# Patient Record
Sex: Female | Born: 1965 | Race: White | Hispanic: No | Marital: Married | Smoking: Never smoker
Health system: Southern US, Community
[De-identification: ages and names within clinical notes are randomized; demographics above are authoritative.]

## PROBLEM LIST (undated history)

## (undated) DIAGNOSIS — Z9889 Other specified postprocedural states: Secondary | ICD-10-CM

## (undated) DIAGNOSIS — T4145XA Adverse effect of unspecified anesthetic, initial encounter: Secondary | ICD-10-CM

## (undated) DIAGNOSIS — R112 Nausea with vomiting, unspecified: Secondary | ICD-10-CM

## (undated) DIAGNOSIS — T8859XA Other complications of anesthesia, initial encounter: Secondary | ICD-10-CM

## (undated) HISTORY — PX: DILATION AND CURETTAGE OF UTERUS: SHX78

## (undated) HISTORY — PX: LITHOTRIPSY: SUR834

## (undated) HISTORY — PX: OTHER SURGICAL HISTORY: SHX169

---

## 1998-02-03 ENCOUNTER — Ambulatory Visit (HOSPITAL_COMMUNITY): Admission: RE | Admit: 1998-02-03 | Discharge: 1998-02-03 | Payer: Self-pay | Admitting: Obstetrics & Gynecology

## 1998-09-10 ENCOUNTER — Inpatient Hospital Stay (HOSPITAL_COMMUNITY): Admission: AD | Admit: 1998-09-10 | Discharge: 1998-09-10 | Payer: Self-pay | Admitting: Obstetrics and Gynecology

## 1998-09-10 ENCOUNTER — Encounter: Payer: Self-pay | Admitting: Obstetrics and Gynecology

## 1998-09-30 ENCOUNTER — Ambulatory Visit (HOSPITAL_COMMUNITY): Admission: RE | Admit: 1998-09-30 | Discharge: 1998-09-30 | Payer: Self-pay | Admitting: Obstetrics and Gynecology

## 1999-05-30 ENCOUNTER — Other Ambulatory Visit: Admission: RE | Admit: 1999-05-30 | Discharge: 1999-05-30 | Payer: Self-pay | Admitting: Gynecology

## 2000-06-21 ENCOUNTER — Other Ambulatory Visit: Admission: RE | Admit: 2000-06-21 | Discharge: 2000-06-21 | Payer: Self-pay | Admitting: Gynecology

## 2002-11-24 ENCOUNTER — Other Ambulatory Visit: Admission: RE | Admit: 2002-11-24 | Discharge: 2002-11-24 | Payer: Self-pay | Admitting: Gynecology

## 2004-06-15 ENCOUNTER — Other Ambulatory Visit: Admission: RE | Admit: 2004-06-15 | Discharge: 2004-06-15 | Payer: Self-pay | Admitting: Obstetrics and Gynecology

## 2005-03-06 ENCOUNTER — Encounter: Admission: RE | Admit: 2005-03-06 | Discharge: 2005-03-06 | Payer: Self-pay | Admitting: Obstetrics and Gynecology

## 2005-10-15 ENCOUNTER — Other Ambulatory Visit: Admission: RE | Admit: 2005-10-15 | Discharge: 2005-10-15 | Payer: Self-pay | Admitting: Obstetrics and Gynecology

## 2011-02-14 ENCOUNTER — Encounter (HOSPITAL_COMMUNITY): Payer: Self-pay | Admitting: Pharmacist

## 2011-02-21 ENCOUNTER — Encounter (HOSPITAL_COMMUNITY)
Admission: RE | Admit: 2011-02-21 | Discharge: 2011-02-21 | Disposition: A | Discharge status: Home / Self Care | Payer: BC Managed Care – PPO | Source: Ambulatory Visit | Attending: Obstetrics and Gynecology | Admitting: Obstetrics and Gynecology

## 2011-02-21 ENCOUNTER — Encounter (HOSPITAL_COMMUNITY): Payer: Self-pay

## 2011-02-21 HISTORY — DX: Nausea with vomiting, unspecified: Z98.890

## 2011-02-21 HISTORY — DX: Nausea with vomiting, unspecified: R11.2

## 2011-02-21 HISTORY — DX: Other complications of anesthesia, initial encounter: T88.59XA

## 2011-02-21 HISTORY — DX: Adverse effect of unspecified anesthetic, initial encounter: T41.45XA

## 2011-02-21 LAB — SURGICAL PCR SCREEN
MRSA, PCR: NEGATIVE
Staphylococcus aureus: NEGATIVE

## 2011-02-21 LAB — CBC
MCHC: 32.2 g/dL (ref 30.0–36.0)
Platelets: 234 10*3/uL (ref 150–400)
RDW: 12.8 % (ref 11.5–15.5)
WBC: 8.4 10*3/uL (ref 4.0–10.5)

## 2011-02-21 NOTE — Patient Instructions (Addendum)
20 MISCHELLE REEG  02/21/2011   Your procedure is scheduled on:  03/01/11  Enter through the Main Entrance of [facility] at 830 AM.  Pick up the phone at the desk and dial [phone].   Call this number if you have problems the morning of surgery: [phone]   Remember:   Do not eat food:After Midnight.  Do not drink clear liquids: After Midnight.  Take these medicines the morning of surgery with A SIP OF WATER: NA   Do not wear jewelry, make-up or nail polish.  Do not wear lotions, powders, or perfumes. You may wear deodorant.  Do not shave 48 hours prior to surgery.  Do not bring valuables to the hospital.  Contacts, dentures or bridgework may not be worn into surgery.  Leave suitcase in the car. After surgery it may be brought to your room.  For patients admitted to the hospital, checkout time is 11:00 AM the day of discharge.   Patients discharged the day of surgery will not be allowed to drive home.  Name and phone number of your driver: Mother   Nile Dear      Special Instructions: CHG Shower Use Special Wash: 1/2 bottle night before surgery and 1/2 bottle morning of surgery.   Please read over the following fact sheets that you were given: MRSA Information

## 2011-02-28 NOTE — H&P (Signed)
Jessica Ball is an 46 y.o. female, P 1001, with heavy menses who desires permanent sterility . She was previously on OCP, but does not want hormonal contraception anymore.  Off OCP she is having heavy menses with BTB every 3 months or so.  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    Pertinent Gynecological History: Last pap: normal Date: 12-12 OB History: G1, P1001, c-section at term   Menstrual History: No LMP recorded.    Past Medical History  Diagnosis Date  . Complication of anesthesia   . PONV (postoperative nausea and vomiting)   Nephrolithiasis  Past Surgical History  Procedure Date  . Dilation and curettage of uterus   . Lithotripsy   Cesarean section Hysteroscopy with resection of septum Laparoscopy for endometriosis  No family history on file.  Social History:  reports that she has quit smoking. She does not have any smokeless tobacco history on file. She reports that she drinks alcohol. She reports that she does not use illicit drugs.  Allergies: No Known Allergies  No prescriptions prior to admission    Review of Systems  Respiratory: Negative.   Cardiovascular: Negative.   Gastrointestinal: Negative.   Genitourinary: Negative.     There were no vitals taken for this visit. Physical Exam  Constitutional: She appears well-developed and well-nourished.  Neck: Neck supple. No thyromegaly present.  Cardiovascular: Normal rate, regular rhythm and normal heart sounds.   No murmur heard. Respiratory: Effort normal and breath sounds normal. No respiratory distress. She has no wheezes.  GI: Soft. She exhibits no distension and no mass. There is no tenderness.       Transverse scar  Genitourinary: Vagina normal.       Uterus is midplanar, normal size No adnexal mass    No results found for this or any previous visit (from the past 24 hour(s)).  No results  found.  Assessment/Plan: Menorrhagia with some BTB, desires permanent sterility.  All medical and surgical options were discussed.  Laparoscopic BTL procedure, risks, failure rate, alternatives discussed.  Endometrial ablation with HTA discussed as well, would use HTA due to possible arcuate uterus vs. Septum.  Will admit on 2021 for laparoscopic BTL and endometrial ablation with HTA.    Ken Bonn D 02/28/2011, 6:35 PM

## 2011-03-01 ENCOUNTER — Ambulatory Visit (HOSPITAL_COMMUNITY): Payer: BC Managed Care – PPO | Admitting: Anesthesiology

## 2011-03-01 ENCOUNTER — Encounter (HOSPITAL_COMMUNITY): Payer: Self-pay | Admitting: Anesthesiology

## 2011-03-01 ENCOUNTER — Inpatient Hospital Stay (HOSPITAL_COMMUNITY)
Admission: AD | Admit: 2011-03-01 | Discharge: 2011-03-01 | Disposition: A | Discharge status: Home / Self Care | Payer: BC Managed Care – PPO | Source: Ambulatory Visit | Attending: Obstetrics and Gynecology | Admitting: Obstetrics and Gynecology

## 2011-03-01 ENCOUNTER — Encounter (HOSPITAL_COMMUNITY): Payer: Self-pay | Admitting: *Deleted

## 2011-03-01 ENCOUNTER — Ambulatory Visit (HOSPITAL_COMMUNITY)
Admission: RE | Admit: 2011-03-01 | Discharge: 2011-03-01 | Disposition: A | Discharge status: Home Health | Payer: BC Managed Care – PPO | Source: Ambulatory Visit | Attending: Obstetrics and Gynecology | Admitting: Obstetrics and Gynecology

## 2011-03-01 ENCOUNTER — Encounter (HOSPITAL_COMMUNITY)
Admission: RE | Disposition: A | Discharge status: Home Health | Payer: Self-pay | Source: Ambulatory Visit | Attending: Obstetrics and Gynecology

## 2011-03-01 DIAGNOSIS — Z01818 Encounter for other preprocedural examination: Secondary | ICD-10-CM | POA: Insufficient documentation

## 2011-03-01 DIAGNOSIS — R300 Dysuria: Secondary | ICD-10-CM

## 2011-03-01 DIAGNOSIS — IMO0002 Reserved for concepts with insufficient information to code with codable children: Secondary | ICD-10-CM | POA: Insufficient documentation

## 2011-03-01 DIAGNOSIS — N803 Endometriosis of pelvic peritoneum, unspecified: Secondary | ICD-10-CM | POA: Insufficient documentation

## 2011-03-01 DIAGNOSIS — Z302 Encounter for sterilization: Secondary | ICD-10-CM | POA: Insufficient documentation

## 2011-03-01 DIAGNOSIS — N920 Excessive and frequent menstruation with regular cycle: Secondary | ICD-10-CM | POA: Diagnosis present

## 2011-03-01 DIAGNOSIS — Z01812 Encounter for preprocedural laboratory examination: Secondary | ICD-10-CM | POA: Insufficient documentation

## 2011-03-01 HISTORY — PX: LAPAROSCOPIC TUBAL LIGATION: SHX1937

## 2011-03-01 LAB — URINALYSIS, ROUTINE W REFLEX MICROSCOPIC
Bilirubin Urine: NEGATIVE
Leukocytes, UA: NEGATIVE
Nitrite: NEGATIVE
Specific Gravity, Urine: >1.03 — ABNORMAL HIGH (ref 1.005–1.030)
pH: 6.5 (ref 5.0–8.0)

## 2011-03-01 SURGERY — LIGATION, FALLOPIAN TUBE, LAPAROSCOPIC
Anesthesia: General | Site: Vagina | Wound class: Clean Contaminated

## 2011-03-01 MED ORDER — METOCLOPRAMIDE HCL 5 MG/ML IJ SOLN: 10.0000 mg | Freq: Once | INTRAMUSCULAR | Status: DC | PRN

## 2011-03-01 MED ORDER — NEOSTIGMINE METHYLSULFATE 1 MG/ML IJ SOLN
INTRAMUSCULAR | Status: AC
  Filled 2011-03-01: qty 10

## 2011-03-01 MED ORDER — MEPERIDINE HCL 25 MG/ML IJ SOLN
INTRAMUSCULAR | Status: AC
  Filled 2011-03-01: qty 1

## 2011-03-01 MED ORDER — ONDANSETRON HCL 4 MG/2ML IJ SOLN
INTRAMUSCULAR | Status: AC
  Filled 2011-03-01: qty 2

## 2011-03-01 MED ORDER — BUPIVACAINE HCL (PF) 0.25 % IJ SOLN
INTRAMUSCULAR | Status: AC
  Filled 2011-03-01: qty 30

## 2011-03-01 MED ORDER — FENTANYL CITRATE 0.05 MG/ML IJ SOLN
25.0000 ug | INTRAMUSCULAR | Status: DC | PRN
  Administered 2011-03-01 (×2): 50 ug via INTRAVENOUS

## 2011-03-01 MED ORDER — DEXAMETHASONE SODIUM PHOSPHATE 10 MG/ML IJ SOLN
INTRAMUSCULAR | Status: AC
  Filled 2011-03-01: qty 1

## 2011-03-01 MED ORDER — LIDOCAINE HCL (CARDIAC) 20 MG/ML IV SOLN
INTRAVENOUS | Status: AC
  Filled 2011-03-01: qty 5

## 2011-03-01 MED ORDER — DEXAMETHASONE SODIUM PHOSPHATE 10 MG/ML IJ SOLN
INTRAMUSCULAR | Status: DC | PRN
  Administered 2011-03-01: 10 mg via INTRAVENOUS

## 2011-03-01 MED ORDER — FENTANYL CITRATE 0.05 MG/ML IJ SOLN
INTRAMUSCULAR | Status: DC | PRN
  Administered 2011-03-01: 50 ug via INTRAVENOUS
  Administered 2011-03-01 (×2): 100 ug via INTRAVENOUS

## 2011-03-01 MED ORDER — SODIUM CHLORIDE 0.9 % IJ SOLN
INTRAMUSCULAR | Status: DC | PRN
  Administered 2011-03-01: 10 mL via INTRAVENOUS

## 2011-03-01 MED ORDER — HYDROMORPHONE HCL PF 1 MG/ML IJ SOLN
INTRAMUSCULAR | Status: AC
  Administered 2011-03-01: 1 mg
  Filled 2011-03-01: qty 1

## 2011-03-01 MED ORDER — MEPERIDINE HCL 25 MG/ML IJ SOLN
6.2500 mg | INTRAMUSCULAR | Status: DC | PRN
  Administered 2011-03-01: 12.5 mg via INTRAVENOUS

## 2011-03-01 MED ORDER — OXYCODONE-ACETAMINOPHEN 5-325 MG PO TABS: 1.0000 | ORAL_TABLET | ORAL | Status: AC | PRN

## 2011-03-01 MED ORDER — SCOPOLAMINE 1 MG/3DAYS TD PT72
MEDICATED_PATCH | TRANSDERMAL | Status: AC
  Filled 2011-03-01: qty 1

## 2011-03-01 MED ORDER — KETOROLAC TROMETHAMINE 30 MG/ML IJ SOLN
INTRAMUSCULAR | Status: DC | PRN
  Administered 2011-03-01: 30 mg via INTRAVENOUS

## 2011-03-01 MED ORDER — ROCURONIUM BROMIDE 50 MG/5ML IV SOLN
INTRAVENOUS | Status: AC
  Filled 2011-03-01: qty 1

## 2011-03-01 MED ORDER — BUPIVACAINE HCL (PF) 0.25 % IJ SOLN
INTRAMUSCULAR | Status: DC | PRN
  Administered 2011-03-01: 10 mL

## 2011-03-01 MED ORDER — MIDAZOLAM HCL 2 MG/2ML IJ SOLN
INTRAMUSCULAR | Status: AC
  Filled 2011-03-01: qty 2

## 2011-03-01 MED ORDER — FENTANYL CITRATE 0.05 MG/ML IJ SOLN
INTRAMUSCULAR | Status: AC
  Filled 2011-03-01: qty 5

## 2011-03-01 MED ORDER — LACTATED RINGERS IV SOLN
INTRAVENOUS | Status: DC
  Administered 2011-03-01 (×2): via INTRAVENOUS

## 2011-03-01 MED ORDER — ROCURONIUM BROMIDE 100 MG/10ML IV SOLN
INTRAVENOUS | Status: DC | PRN
  Administered 2011-03-01: 30 mg via INTRAVENOUS

## 2011-03-01 MED ORDER — SODIUM CHLORIDE 0.9 % IR SOLN
Status: DC | PRN
  Administered 2011-03-01: 3000 mL

## 2011-03-01 MED ORDER — FENTANYL CITRATE 0.05 MG/ML IJ SOLN
INTRAMUSCULAR | Status: AC
  Administered 2011-03-01: 50 ug via INTRAVENOUS
  Filled 2011-03-01: qty 2

## 2011-03-01 MED ORDER — SCOPOLAMINE 1 MG/3DAYS TD PT72
1.0000 | MEDICATED_PATCH | Freq: Once | TRANSDERMAL | Status: DC
  Administered 2011-03-01: 1.5 mg via TRANSDERMAL

## 2011-03-01 MED ORDER — GLYCOPYRROLATE 0.2 MG/ML IJ SOLN
INTRAMUSCULAR | Status: DC | PRN
  Administered 2011-03-01: .4 mg via INTRAVENOUS

## 2011-03-01 MED ORDER — KETOROLAC TROMETHAMINE 30 MG/ML IJ SOLN
INTRAMUSCULAR | Status: AC
  Filled 2011-03-01: qty 1

## 2011-03-01 MED ORDER — NEOSTIGMINE METHYLSULFATE 1 MG/ML IJ SOLN
INTRAMUSCULAR | Status: DC | PRN
  Administered 2011-03-01: 2 mg via INTRAVENOUS

## 2011-03-01 MED ORDER — LIDOCAINE HCL (CARDIAC) 20 MG/ML IV SOLN
INTRAVENOUS | Status: DC | PRN
  Administered 2011-03-01: 60 mg via INTRAVENOUS

## 2011-03-01 MED ORDER — LIDOCAINE HCL 2 % IJ SOLN
INTRAMUSCULAR | Status: AC
  Filled 2011-03-01: qty 1

## 2011-03-01 MED ORDER — LIDOCAINE HCL 2 % IJ SOLN
INTRAMUSCULAR | Status: DC | PRN
  Administered 2011-03-01: 16 mL

## 2011-03-01 MED ORDER — PROPOFOL 10 MG/ML IV EMUL
INTRAVENOUS | Status: DC | PRN
  Administered 2011-03-01: 20 mg via INTRAVENOUS
  Administered 2011-03-01: 30 mg via INTRAVENOUS
  Administered 2011-03-01: 150 mg via INTRAVENOUS

## 2011-03-01 MED ORDER — PROPOFOL 10 MG/ML IV EMUL
INTRAVENOUS | Status: AC
  Filled 2011-03-01: qty 20

## 2011-03-01 MED ORDER — ONDANSETRON HCL 4 MG/2ML IJ SOLN
INTRAMUSCULAR | Status: DC | PRN
  Administered 2011-03-01: 4 mg via INTRAVENOUS

## 2011-03-01 MED ORDER — GLYCOPYRROLATE 0.2 MG/ML IJ SOLN
INTRAMUSCULAR | Status: AC
  Filled 2011-03-01: qty 2

## 2011-03-01 MED ORDER — MIDAZOLAM HCL 5 MG/5ML IJ SOLN
INTRAMUSCULAR | Status: DC | PRN
  Administered 2011-03-01: 2 mg via INTRAVENOUS

## 2011-03-01 SURGICAL SUPPLY — 29 items
ADH SKN CLS APL DERMABOND .7 (GAUZE/BANDAGES/DRESSINGS) ×2
CATH ROBINSON RED A/P 16FR (CATHETERS) ×3 IMPLANT
CHLORAPREP W/TINT 26ML (MISCELLANEOUS) ×3 IMPLANT
CLOTH BEACON ORANGE TIMEOUT ST (SAFETY) ×3 IMPLANT
CONTAINER PREFILL 10% NBF 60ML (FORM) ×6 IMPLANT
DERMABOND ADVANCED (GAUZE/BANDAGES/DRESSINGS) ×1
DERMABOND ADVANCED .7 DNX12 (GAUZE/BANDAGES/DRESSINGS) ×2 IMPLANT
DRAPE CAMERA CLOSED 9X96 (DRAPES) ×3 IMPLANT
DRAPE HYSTEROSCOPY (DRAPE) ×3 IMPLANT
GLOVE BIO SURGEON STRL SZ8 (GLOVE) ×3 IMPLANT
GLOVE ORTHO TXT STRL SZ7.5 (GLOVE) ×6 IMPLANT
GOWN PREVENTION PLUS LG XLONG (DISPOSABLE) ×3 IMPLANT
GOWN PREVENTION PLUS XLARGE (GOWN DISPOSABLE) ×3 IMPLANT
GOWN STRL NON-REIN LRG LVL3 (GOWN DISPOSABLE) ×3 IMPLANT
GOWN STRL REIN XL XLG (GOWN DISPOSABLE) ×3 IMPLANT
NEEDLE INSUFFLATION 14GA 120MM (NEEDLE) ×3 IMPLANT
NEEDLE SPNL 22GX3.5 QUINCKE BK (NEEDLE) ×3 IMPLANT
PACK LAPAROSCOPY BASIN (CUSTOM PROCEDURE TRAY) ×3 IMPLANT
PACK VAGINAL MINOR WOMEN LF (CUSTOM PROCEDURE TRAY) ×3 IMPLANT
SET GENESYS HTA PROCERVA (MISCELLANEOUS) ×3 IMPLANT
SUT VIC AB 3-0 CTX 36 (SUTURE) IMPLANT
SUT VIC AB 3-0 PS2 18 (SUTURE) ×3
SUT VIC AB 3-0 PS2 18XBRD (SUTURE) ×2 IMPLANT
SUT VICRYL 0 UR6 27IN ABS (SUTURE) IMPLANT
SYR CONTROL 10ML LL (SYRINGE) ×3 IMPLANT
TOWEL OR 17X24 6PK STRL BLUE (TOWEL DISPOSABLE) ×6 IMPLANT
TROCAR Z-THREAD FIOS 11X100 BL (TROCAR) ×3 IMPLANT
WARMER LAPAROSCOPE (MISCELLANEOUS) ×3 IMPLANT
WATER STERILE IRR 1000ML POUR (IV SOLUTION) ×3 IMPLANT

## 2011-03-01 NOTE — Progress Notes (Signed)
Pt has not voided since surgery today at 1000

## 2011-03-01 NOTE — ED Provider Notes (Signed)
History     No chief complaint on file.  HPI 46 y.o. G1P0001 s/p laproscopic surgery this morning for BTL and endometriosis fulgration. States she has been unable to void since surgery. No pain.     Past Medical History  Diagnosis Date  . Complication of anesthesia   . PONV (postoperative nausea and vomiting)     Past Surgical History  Procedure Date  . Dilation and curettage of uterus   . Lithotripsy     History reviewed. No pertinent family history.  History  Substance Use Topics  . Smoking status: Former Games developer  . Smokeless tobacco: Not on file  . Alcohol Use: Yes    Allergies: No Known Allergies  Prescriptions prior to admission  Medication Sig Dispense Refill  . ibuprofen (ADVIL,MOTRIN) 200 MG tablet Take 200 mg by mouth every 6 (six) hours as needed. General pain/Cramps      . Loratadine (CLARITIN PO) Take 1 tablet by mouth daily as needed. Patient used this medication for allergies.      Marland Kitchen oxyCODONE-acetaminophen (ROXICET) 5-325 MG per tablet Take 1-2 tablets by mouth every 4 (four) hours as needed for pain.  20 tablet  0  . Pseudoephedrine HCl (SUDAFED PO) Take 2 tablets by mouth daily as needed. Patient used this medication for a cold.      . naproxen (NAPROSYN) 250 MG tablet Take 250 mg by mouth as needed. For pain/cramps        Review of Systems  Constitutional: Negative.   Respiratory: Negative.   Cardiovascular: Negative.   Gastrointestinal: Negative for nausea, vomiting, abdominal pain, diarrhea and constipation.  Genitourinary: Negative for dysuria, urgency, frequency, hematuria and flank pain.       Positive for inability to void   Musculoskeletal: Negative.   Neurological: Negative.   Psychiatric/Behavioral: Negative.    Physical Exam   Blood pressure 112/69, pulse 86, temperature 97.2 F (36.2 C), temperature source Oral, resp. rate 18, last menstrual period 02/07/2011.  Physical Exam  Nursing note and vitals reviewed. Constitutional: She  is oriented to person, place, and time. She appears well-developed and well-nourished. No distress.  Cardiovascular: Normal rate.   Respiratory: Effort normal.  GI: Soft. There is no tenderness.       Incision at umbilicus intact, no bleeding    Genitourinary:       300 ml dark urine drained from bladder  Musculoskeletal: Normal range of motion.  Neurological: She is oriented to person, place, and time.  Skin: Skin is warm and dry.  Psychiatric: She has a normal mood and affect.    MAU Course  Procedures  Results for orders placed during the hospital encounter of 03/01/11 (from the past 24 hour(s))  URINALYSIS, ROUTINE W REFLEX MICROSCOPIC     Status: Abnormal   Collection Time   03/01/11  8:05 PM      Component Value Range   Color, Urine AMBER (*) YELLOW    APPearance CLEAR  CLEAR    Specific Gravity, Urine >1.030 (*) 1.005 - 1.030    pH 6.5  5.0 - 8.0    Glucose, UA NEGATIVE  NEGATIVE (mg/dL)   Hgb urine dipstick NEGATIVE  NEGATIVE    Bilirubin Urine NEGATIVE  NEGATIVE    Ketones, ur NEGATIVE  NEGATIVE (mg/dL)   Protein, ur NEGATIVE  NEGATIVE (mg/dL)   Urobilinogen, UA 0.2  0.0 - 1.0 (mg/dL)   Nitrite NEGATIVE  NEGATIVE    Leukocytes, UA NEGATIVE  NEGATIVE    Pt stated  she felt the urge to void immediately after in and out cath, was able to void a small amount on her own, stated she had burning with urination  Assessment and Plan  46 y.o. G1P0001 postop day #0 Dysuria/difficulty voiding No evidence of UTI D/C home, PO hydrate, f/u if symptoms do not improve  Julianah Marciel 03/01/2011, 8:22 PM

## 2011-03-01 NOTE — Progress Notes (Signed)
Date of Initial H&P: 02-28-11  History reviewed, patient examined, no change in status, stable for surgery. 

## 2011-03-01 NOTE — Transfer of Care (Signed)
Immediate Anesthesia Transfer of Care Note  Patient: Jessica Ball  Procedure(s) Performed: Procedure(s) (LRB): LAPAROSCOPIC TUBAL LIGATION (Bilateral) DILATATION & CURETTAGE/HYSTEROSCOPY WITH HYDROTHERMAL ABLATION (N/A)  Patient Location: PACU  Anesthesia Type: General  Level of Consciousness: oriented and sedated  Airway & Oxygen Therapy: Patient Spontanous Breathing and Patient connected to nasal cannula oxygen  Post-op Assessment: Report given to PACU RN and Post -op Vital signs reviewed and stable  Post vital signs: stable  Complications: No apparent anesthesia complications

## 2011-03-01 NOTE — Anesthesia Postprocedure Evaluation (Signed)
  Anesthesia Post-op Note  Patient: Jessica Ball  Procedure(s) Performed: Procedure(s) (LRB): LAPAROSCOPIC TUBAL LIGATION (Bilateral) DILATATION & CURETTAGE/HYSTEROSCOPY WITH HYDROTHERMAL ABLATION (N/A)  Patient Location: PACU  Anesthesia Type: General  Level of Consciousness: awake, alert  and oriented  Airway and Oxygen Therapy: Patient Spontanous Breathing  Post-op Pain: none  Post-op Assessment: Post-op Vital signs reviewed, Patient's Cardiovascular Status Stable, Respiratory Function Stable, Patent Airway, No signs of Nausea or vomiting and Pain level controlled  Post-op Vital Signs: Reviewed and stable  Complications: No apparent anesthesia complications

## 2011-03-01 NOTE — Op Note (Signed)
Preoperative diagnosis: Desires surgical sterility, menorrhagia Postoperative diagnosis: Same, pelvic endometriosis Procedure: Laparoscopic bilateral tubal fulguration, fulguration of endometriosis, HTA Surgeon: Lavina Hamman M.D. Anesthesia: Gen. Endotracheal tube Findings: She had a normal uterus, right ovary was adherent to the posterior uterus, right tube was normal, adhesions around the left tube made the fimbria difficult to visualize, several implants of endometriosis around both round ligaments and the anterior culdesac, bowel adherent to the right pelvic brim.  No evidence of an arcuate uterus.  Endometrial cavity appeared normal.   Estimated blood loss: Minimal Complications: None  Procedure in detail: The patient was taken to the operating room and placed in the dorsosupine position. General anesthesia was induced. Her left arm was tucked to her side and legs were placed in mobile stirrups. Abdomen was then prepped and draped in the usual sterile fashion, bladder drained with a red Robinson catheter, Hulka tenaculum applied to the cervix for uterine manipulation. Infraumbilical skin was then infiltrated with quarter percent Marcaine and a 1 cm vertical incision was made. The Veress needle was inserted into the peritoneal cavity and placement confirmed by the water drop test an opening pressure of 3 mm of mercury. CO2 was insufflated to a pressure of 14 mm of mercury and the Veress needle was removed. A 10/11 disposable trocar was then introduced with direct visualization with the laparoscope. The operating scope was then inserted. Good visualization was achieved, inspection revealed the above-mentioned findings. Both fallopian tubes were identified and traced to their fimbriated ends as best as possible. A 3 cm portion of the middle of each tube was fulgurated with bipolar cautery until the amp meter read 0 in all spots along a 3 cm segment. This was done on both sides with good fulguration of  both tubes and good hemostasis. No complications were encountered. The laparoscope was removed. All gas was allowed to deflate from the abdomen and the trocar was removed. Fascia was approximated with one suture of 3-0 Vicryl. Skin incision was closed with interrupted subcuticular sutures of 4-0 Vicryl followed by Dermabond. The Hulka tenaculum was removed at the beginning of the case as it appeared to be close to perforating the anterior uterus.   Attention was turned vaginally, the legs were elevated in the mobile stirrups.  A Graves speculum was inserted into the vagina.  The anterior lip of the cervix was grasped with a single-tooth tenaculum. Deep paracervical block was then performed with a total of 16 cc of 2% plain lidocaine. She was also given IV Toradol. Uterus then sounded to 8-9 cm. Cervix was gradually dilated to size 23 dilator without difficulty. The HTA hysteroscope was inserted and good visualization was achieved.  The cavity appeared normal with a fair amount of endometrial tissue.  Ablation was then carried out as per instructions on the machine.  The process was stopped 4 times due to possible leaking fluid.  No fluid was seen leaking from the cervix, but after the third stoppage, a single tooth tenaculum was also placed on the posterior lip of the cervix to help close it over the scope. At the end of the procedure there appeared to be good global endometrial ablation. The hysteroscope was removed. The single-tooth tenaculums were removed from the cervix and bleeding was controlled with pressure. All instruments were then removed from the vagina. The patient tolerated procedure well and was taken to the recovery room in stable condition. Counts were correct and she had PAS hose on throughout the procedure.

## 2011-03-01 NOTE — Progress Notes (Signed)
Pt states she has not voided since the morning at 10am

## 2011-03-01 NOTE — Anesthesia Preprocedure Evaluation (Signed)
Anesthesia Evaluation    History of Anesthesia Complications (+) PONV  Airway Mallampati: II TM Distance: >3 FB Neck ROM: Full    Dental No notable dental hx. (+) Teeth Intact   Pulmonary neg pulmonary ROS,  clear to auscultation  Pulmonary exam normal       Cardiovascular neg cardio ROS Regular Normal    Neuro/Psych Negative Neurological ROS  Negative Psych ROS   GI/Hepatic negative GI ROS, Neg liver ROS,   Endo/Other  Negative Endocrine ROS  Renal/GU Hx/o Renal Calculi  Genitourinary negative   Musculoskeletal negative musculoskeletal ROS (+)   Abdominal   Peds  Hematology negative hematology ROS (+)   Anesthesia Other Findings   Reproductive/Obstetrics negative OB ROS                           Anesthesia Physical Anesthesia Plan  ASA: II  Anesthesia Plan: General   Post-op Pain Management:    Induction: Intravenous  Airway Management Planned: Oral ETT  Additional Equipment:   Intra-op Plan:   Post-operative Plan: Extubation in OR  Informed Consent: I have reviewed the patients History and Physical, chart, labs and discussed the procedure including the risks, benefits and alternatives for the proposed anesthesia with the patient or authorized representative who has indicated his/her understanding and acceptance.   Dental advisory given  Plan Discussed with: CRNA, Anesthesiologist and Surgeon  Anesthesia Plan Comments:         Anesthesia Quick Evaluation

## 2011-03-02 ENCOUNTER — Encounter (HOSPITAL_COMMUNITY): Payer: Self-pay | Admitting: Obstetrics and Gynecology

## 2013-02-09 ENCOUNTER — Ambulatory Visit (INDEPENDENT_AMBULATORY_CARE_PROVIDER_SITE_OTHER): Payer: BC Managed Care – PPO | Admitting: Internal Medicine

## 2013-02-09 ENCOUNTER — Encounter: Payer: Self-pay | Admitting: Internal Medicine

## 2013-02-09 VITALS — BP 111/76 | HR 101 | Temp 98.7°F | Resp 18 | Ht 62.0 in | Wt 116.0 lb

## 2013-02-09 DIAGNOSIS — Z780 Asymptomatic menopausal state: Secondary | ICD-10-CM

## 2013-02-09 DIAGNOSIS — Z1371 Encounter for nonprocreative screening for genetic disease carrier status: Secondary | ICD-10-CM

## 2013-02-09 DIAGNOSIS — Z9889 Other specified postprocedural states: Secondary | ICD-10-CM

## 2013-02-09 DIAGNOSIS — J309 Allergic rhinitis, unspecified: Secondary | ICD-10-CM | POA: Insufficient documentation

## 2013-02-09 DIAGNOSIS — N809 Endometriosis, unspecified: Secondary | ICD-10-CM | POA: Insufficient documentation

## 2013-02-09 DIAGNOSIS — Z803 Family history of malignant neoplasm of breast: Secondary | ICD-10-CM | POA: Insufficient documentation

## 2013-02-09 DIAGNOSIS — N951 Menopausal and female climacteric states: Secondary | ICD-10-CM

## 2013-02-09 DIAGNOSIS — Z8679 Personal history of other diseases of the circulatory system: Secondary | ICD-10-CM

## 2013-02-09 DIAGNOSIS — Z808 Family history of malignant neoplasm of other organs or systems: Secondary | ICD-10-CM

## 2013-02-09 LAB — CBC WITH DIFFERENTIAL/PLATELET
BASOS ABS: 0 10*3/uL (ref 0.0–0.1)
BASOS PCT: 1 % (ref 0–1)
EOS ABS: 0.2 10*3/uL (ref 0.0–0.7)
EOS PCT: 3 % (ref 0–5)
HEMATOCRIT: 42.2 % (ref 36.0–46.0)
Hemoglobin: 14.6 g/dL (ref 12.0–15.0)
LYMPHS PCT: 31 % (ref 12–46)
Lymphs Abs: 2.2 10*3/uL (ref 0.7–4.0)
MCH: 31.6 pg (ref 26.0–34.0)
MCHC: 34.6 g/dL (ref 30.0–36.0)
MCV: 91.3 fL (ref 78.0–100.0)
MONO ABS: 0.6 10*3/uL (ref 0.1–1.0)
Monocytes Relative: 8 % (ref 3–12)
Neutro Abs: 3.9 10*3/uL (ref 1.7–7.7)
Neutrophils Relative %: 57 % (ref 43–77)
PLATELETS: 277 10*3/uL (ref 150–400)
RBC: 4.62 MIL/uL (ref 3.87–5.11)
RDW: 12.7 % (ref 11.5–15.5)
WBC: 6.9 10*3/uL (ref 4.0–10.5)

## 2013-02-09 MED ORDER — ESCITALOPRAM OXALATE 5 MG PO TABS: ORAL_TABLET | ORAL | Status: DC

## 2013-02-09 NOTE — Progress Notes (Signed)
Subjective:    Patient ID: Jessica Ball, female    DOB: 09/25/1965, 48 y.o.   MRN: [id number]  HPI Jessica Ball is new pt here for primary care.   PMH of allergic rhinitis,  Endometriosis.  She is S/P uterine ablation and tubal ligation.  She has a very strong FH of breast cancer in both mother and father beginning in their 2070's  She is concerned about multiple hot flushes mostly at night, mood irritability and weight gain.  Wakes up 5 x's at night with sweating.    She has not had a mm or pap in several years now  Significant stress now with trying to care for aging parents (mother with Alzheimers) and teenage children.   She is aware this may make her irritable.  Denies depressed mood.  No Known Allergies Past Medical History  Diagnosis Date  . Complication of anesthesia   . PONV (postoperative nausea and vomiting)    Past Surgical History  Procedure Laterality Date  . Dilation and curettage of uterus    . Lithotripsy    . Laparoscopic tubal ligation  03/01/2011    Procedure: LAPAROSCOPIC TUBAL LIGATION;  Surgeon: Zenaida Nieceodd D Meisinger, MD;  Location: [facility];  Service: Gynecology;  Laterality: Bilateral;  . Urterine ablation     History   Social History  . Marital Status: Married    Spouse Name: N/A    Number of Children: N/A  . Years of Education: N/A   Occupational History  . Not on file.   Social History Main Topics  . Smoking status: Never Smoker   . Smokeless tobacco: Never Used  . Alcohol Use: 1.2 oz/week    2 Glasses of wine per week     Comment: 2 glasses nightly  . Drug Use: No  . Sexual Activity: Yes    Birth Control/ Protection: Surgical   Other Topics Concern  . Not on file   Social History Narrative  . No narrative on file   Family History  Problem Relation Age of Onset  . Cancer Mother [id number]    breast  . Cancer Father [phone]    breast  . Dementia Paternal Grandmother    Patient Active Problem List   Diagnosis Date Noted  . History of endometrial  ablation 02/09/2013  . Menorrhagia 03/01/2011   Current Outpatient Prescriptions on File Prior to Visit  Medication Sig Dispense Refill  . ibuprofen (ADVIL,MOTRIN) 200 MG tablet Take 200 mg by mouth every 6 (six) hours as needed. General pain/Cramps       No current facility-administered medications on file prior to visit.       Review of Systems See hPI    Objective:   Physical Exam Physical Exam  Nursing note and vitals reviewed.  Constitutional: She is oriented to person, place, and time. She appears well-developed and well-nourished.  HENT:  Head: Normocephalic and atraumatic.  Cardiovascular: Normal rate and regular rhythm. Exam reveals no gallop and no friction rub.  No murmur heard.  Pulmonary/Chest: Breath sounds normal. She has no wheezes. She has no rales.  Neurological: She is alert and oriented to person, place, and time.  Skin: Skin is warm and dry.  Psychiatric: She has a normal mood and affect. Her behavior is normal.             Assessment & Plan:  Night sweats    Will check FSH  Due to high risk of Breast cancer  Will try Lexapro 5mg  for  one week then 10 mg.  Advised cooling pillow and can take i-cool     Irritability  See above  FH brreast cancer in both parents  Endometriosis     Allergic rhinitis.    S/P uterine ablation    Will get all labs today

## 2013-02-09 NOTE — Patient Instructions (Signed)
Activate my chart    See me in 8 weeks  30 mins   Get cooling pillow  Chillow  Use I-cool  One tablet daily

## 2013-02-10 ENCOUNTER — Telehealth: Payer: Self-pay | Admitting: *Deleted

## 2013-02-10 LAB — TSH: TSH: 7.795 u[IU]/mL — AB (ref 0.350–4.500)

## 2013-02-10 LAB — COMPREHENSIVE METABOLIC PANEL
ALK PHOS: 61 U/L (ref 39–117)
ALT: 82 U/L — AB (ref 0–35)
AST: 97 U/L — AB (ref 0–37)
Albumin: 4.8 g/dL (ref 3.5–5.2)
BILIRUBIN TOTAL: 0.5 mg/dL (ref 0.2–1.2)
BUN: 13 mg/dL (ref 6–23)
CALCIUM: 9.9 mg/dL (ref 8.4–10.5)
CHLORIDE: 103 meq/L (ref 96–112)
CO2: 28 mEq/L (ref 19–32)
CREATININE: 0.62 mg/dL (ref 0.50–1.10)
Glucose, Bld: 86 mg/dL (ref 70–99)
Potassium: 4 mEq/L (ref 3.5–5.3)
Sodium: 140 mEq/L (ref 135–145)
Total Protein: 7.7 g/dL (ref 6.0–8.3)

## 2013-02-10 LAB — LIPID PANEL
CHOL/HDL RATIO: 2.1 ratio
Cholesterol: 211 mg/dL — ABNORMAL HIGH (ref 0–200)
HDL: 101 mg/dL (ref >39)
LDL Cholesterol: 95 mg/dL (ref 0–99)
Triglycerides: 77 mg/dL (ref <150)
VLDL: 15 mg/dL (ref 0–40)

## 2013-02-10 LAB — VITAMIN D 25 HYDROXY (VIT D DEFICIENCY, FRACTURES): VIT D 25 HYDROXY: 46 ng/mL (ref 30–89)

## 2013-02-10 LAB — FOLLICLE STIMULATING HORMONE: FSH: 132.2 m[IU]/mL — ABNORMAL HIGH

## 2013-02-10 NOTE — Telephone Encounter (Signed)
Message copied by Malena CatholicBAKER, Valita Righter L on Tue Feb 10, 2013  8:20 AM ------      Message from: Raechel ChuteSCHOENHOFF, DEBORAH D      Created: Tue Feb 10, 2013  8:08 AM       Andry Bogden              Call pt and let her know that her liver enzyme blood test and her thyroid blood test are both too high            Give her a 30 min follow up with me in next 2 weeks  - I will need to recheck these labs then -  Does not need a sooner appt.              Message back with appt date  thanks ------

## 2013-02-10 NOTE — Telephone Encounter (Signed)
[place] and gave her lab results.  Also made a follow up appt for 02/23/13 at 9:45 am.

## 2013-02-20 ENCOUNTER — Other Ambulatory Visit: Payer: Self-pay | Admitting: *Deleted

## 2013-02-20 DIAGNOSIS — R7989 Other specified abnormal findings of blood chemistry: Secondary | ICD-10-CM

## 2013-02-20 DIAGNOSIS — R748 Abnormal levels of other serum enzymes: Secondary | ICD-10-CM

## 2013-02-20 LAB — TSH: TSH: 11.683 u[IU]/mL — AB (ref 0.350–4.500)

## 2013-02-20 LAB — HEPATIC FUNCTION PANEL
ALT: 18 U/L (ref 0–35)
AST: 23 U/L (ref 0–37)
Albumin: 4.3 g/dL (ref 3.5–5.2)
Alkaline Phosphatase: 54 U/L (ref 39–117)
BILIRUBIN DIRECT: 0.1 mg/dL (ref 0.0–0.3)
Indirect Bilirubin: 0.6 mg/dL (ref 0.2–1.2)
TOTAL PROTEIN: 7 g/dL (ref 6.0–8.3)
Total Bilirubin: 0.7 mg/dL (ref 0.2–1.2)

## 2013-02-23 ENCOUNTER — Ambulatory Visit: Payer: BC Managed Care – PPO | Admitting: Internal Medicine

## 2013-02-25 ENCOUNTER — Ambulatory Visit (INDEPENDENT_AMBULATORY_CARE_PROVIDER_SITE_OTHER): Payer: BC Managed Care – PPO | Admitting: Internal Medicine

## 2013-02-25 ENCOUNTER — Encounter: Payer: Self-pay | Admitting: Internal Medicine

## 2013-02-25 ENCOUNTER — Ambulatory Visit (HOSPITAL_BASED_OUTPATIENT_CLINIC_OR_DEPARTMENT_OTHER)
Admission: RE | Admit: 2013-02-25 | Discharge: 2013-02-25 | Disposition: A | Discharge status: Home / Self Care | Payer: BC Managed Care – PPO | Source: Ambulatory Visit | Attending: Internal Medicine | Admitting: Internal Medicine

## 2013-02-25 VITALS — BP 122/72 | HR 98 | Temp 98.7°F | Resp 18 | Wt 118.0 lb

## 2013-02-25 DIAGNOSIS — R945 Abnormal results of liver function studies: Secondary | ICD-10-CM

## 2013-02-25 DIAGNOSIS — E039 Hypothyroidism, unspecified: Secondary | ICD-10-CM

## 2013-02-25 DIAGNOSIS — R454 Irritability and anger: Secondary | ICD-10-CM

## 2013-02-25 DIAGNOSIS — E042 Nontoxic multinodular goiter: Secondary | ICD-10-CM | POA: Insufficient documentation

## 2013-02-25 DIAGNOSIS — E049 Nontoxic goiter, unspecified: Secondary | ICD-10-CM

## 2013-02-25 DIAGNOSIS — R7989 Other specified abnormal findings of blood chemistry: Secondary | ICD-10-CM

## 2013-02-25 MED ORDER — LEVOTHYROXINE SODIUM 75 MCG PO TABS: 75.0000 ug | ORAL_TABLET | Freq: Every day | ORAL | Status: DC

## 2013-02-25 MED ORDER — BUPROPION HCL ER (XL) 150 MG PO TB24: 150.0000 mg | ORAL_TABLET | Freq: Every day | ORAL | Status: DC

## 2013-02-25 NOTE — Progress Notes (Signed)
Subjective:    Patient ID: Jessica Ball, female    DOB: 06/16/1965, 48 y.o.   MRN: [id number]  HPI  Jessica Ball is here for follow up.  See lfts and TSH  TSH rising.  She has been fatigued,  No weight changes  She does have dry skin.  No menses since December,  No history of irradiation to neck.  No FH of thyroid cancer.  Sister and mother both on thyroid medication  She has been on Lexapro.  Flushes much improved but she has delayed orgasm and wishes to try something else.  Irritability and mood  Much better on the Lexapro  Lfts now normalized on Recheck.  Reports no OTC herbs, supplements.  No hepatitis exposure,  She reports she was drinking heavily during Super Bowl Sunday the day before testing.  No Known Allergies Past Medical History  Diagnosis Date  . Complication of anesthesia   . PONV (postoperative nausea and vomiting)    Past Surgical History  Procedure Laterality Date  . Dilation and curettage of uterus    . Lithotripsy    . Laparoscopic tubal ligation  03/01/2011    Procedure: LAPAROSCOPIC TUBAL LIGATION;  Surgeon: Zenaida Nieceodd D Meisinger, MD;  Location: [facility];  Service: Gynecology;  Laterality: Bilateral;  . Urterine ablation     History   Social History  . Marital Status: Married    Spouse Name: N/A    Number of Children: N/A  . Years of Education: N/A   Occupational History  . Not on file.   Social History Main Topics  . Smoking status: Never Smoker   . Smokeless tobacco: Never Used  . Alcohol Use: 1.2 oz/week    2 Glasses of wine per week     Comment: 2 glasses nightly  . Drug Use: No  . Sexual Activity: Yes    Birth Control/ Protection: Surgical   Other Topics Concern  . Not on file   Social History Narrative  . No narrative on file   Family History  Problem Relation Age of Onset  . Cancer Mother [phone]    breast  . Cancer Father [phone]    breast  . Dementia Paternal Grandmother    Patient Active Problem List   Diagnosis Date Noted  . History of  endometrial ablation 02/09/2013  . Family history of breast cancer in mother 02/09/2013  . Family history of breast cancer in female 02/09/2013  . Allergic rhinitis 02/09/2013  . Endometriosis 02/09/2013  . Menorrhagia 03/01/2011   Current Outpatient Prescriptions on File Prior to Visit  Medication Sig Dispense Refill  . cetirizine (ZYRTEC) 10 MG tablet Take 10 mg by mouth as needed for allergies.      Marland Kitchen. escitalopram (LEXAPRO) 5 MG tablet Take one at hs for one week then 2 tablets at hs  60 tablet  1  . ibuprofen (ADVIL,MOTRIN) 200 MG tablet Take 200 mg by mouth every 6 (six) hours as needed. General pain/Cramps       No current facility-administered medications on file prior to visit.      Review of Systems See HPI     Objective:   Physical Exam Physical Exam  Nursing note and vitals reviewed.  Constitutional: She is oriented to person, place, and time. She appears well-developed and well-nourished.  HENT:  Head: Normocephalic and atraumatic.  Neck  She has small nodule palpated on Left side of thyroid.   Cardiovascular: Normal rate and regular rhythm. Exam reveals no gallop and  no friction rub.  No murmur heard.  Pulmonary/Chest: Breath sounds normal. She has no wheezes. She has no rales.  Neurological: She is alert and oriented to person, place, and time.  Skin: Skin is warm and dry.  Psychiatric: She has a normal mood and affect. Her behavior is normal.         Assessment & Plan:  Elevated TSH./  ?? Thyroid nodule on exam   Low risk for carcinoma  But will  Get ultra sound today and initiated  Levothyroxine 75 mcg daily  See me in 3 months  Elevated lfts  Normalized now.    Menopause  :  OK to use Luvena,  Cooling pillow and genitstein  Irritability  Will taper of Lexapro next 2 weeks and change to WEllbutrin as she does not like sexual side effects of Lexapro.    Wellbutrin   150 mg daily when off her lexapro.     See me in 3 months or sooner prn

## 2013-02-25 NOTE — Patient Instructions (Addendum)
Take Lexapro one tablet daily for 7 days,  Then one tablet every other day for 7 days then stop  Start Wellbutrin 150 mg daily when finished with Lexapro  See me in 3 months  30 mins  Go to xray today for ultrasound

## 2013-03-03 ENCOUNTER — Telehealth: Payer: Self-pay | Admitting: Internal Medicine

## 2013-03-03 NOTE — Telephone Encounter (Signed)
Spoke with pt and informed of thyroid ultrasound result.  Will repeat in one year.  Continue levothyroxine for now  See me in 3 months  She voices understanding

## 2013-03-18 ENCOUNTER — Ambulatory Visit
Admission: RE | Admit: 2013-03-18 | Discharge: 2013-03-18 | Disposition: A | Discharge status: Home / Self Care | Payer: BC Managed Care – PPO | Source: Ambulatory Visit | Attending: Internal Medicine | Admitting: Internal Medicine

## 2013-03-18 DIAGNOSIS — Z803 Family history of malignant neoplasm of breast: Secondary | ICD-10-CM

## 2013-04-06 ENCOUNTER — Ambulatory Visit: Payer: BC Managed Care – PPO | Admitting: Internal Medicine

## 2013-04-08 ENCOUNTER — Ambulatory Visit: Payer: BC Managed Care – PPO | Admitting: Internal Medicine

## 2013-04-15 ENCOUNTER — Ambulatory Visit (INDEPENDENT_AMBULATORY_CARE_PROVIDER_SITE_OTHER): Payer: BC Managed Care – PPO | Admitting: Internal Medicine

## 2013-04-15 ENCOUNTER — Encounter: Payer: Self-pay | Admitting: Internal Medicine

## 2013-04-15 ENCOUNTER — Other Ambulatory Visit: Payer: Self-pay | Admitting: Internal Medicine

## 2013-04-15 VITALS — BP 120/72 | HR 107 | Temp 98.9°F | Resp 18

## 2013-04-15 DIAGNOSIS — E039 Hypothyroidism, unspecified: Secondary | ICD-10-CM

## 2013-04-15 DIAGNOSIS — E041 Nontoxic single thyroid nodule: Secondary | ICD-10-CM | POA: Insufficient documentation

## 2013-04-15 NOTE — Progress Notes (Signed)
Subjective:    Patient ID: Jessica Ball, female    DOB: 1965-11-04, 48 y.o.   MRN: [id number]  HPI  Jessica Ball is here for follow up .  She has been on her synthroid  75 mcg and feels much better.  No hot flushing.   She could not tolerate Lexapro and was going to try Wellbutrin but pt felt so much better she did not take it.    MOther gramdmother all on thyroid meds,  Pt never had XRT to head or neck    U/S done 02/2013 showed  4 mm nodule left lobe  She also had the return of a menstrual cycle in February.    No Known Allergies Past Medical History  Diagnosis Date  . Complication of anesthesia   . PONV (postoperative nausea and vomiting)    Past Surgical History  Procedure Laterality Date  . Dilation and curettage of uterus    . Lithotripsy    . Laparoscopic tubal ligation  03/01/2011    Procedure: LAPAROSCOPIC TUBAL LIGATION;  Surgeon: Zenaida Niece, MD;  Location: [facility];  Service: Gynecology;  Laterality: Bilateral;  . Urterine ablation     History   Social History  . Marital Status: Married    Spouse Name: N/A    Number of Children: N/A  . Years of Education: N/A   Occupational History  . Not on file.   Social History Main Topics  . Smoking status: Never Smoker   . Smokeless tobacco: Never Used  . Alcohol Use: 1.2 oz/week    2 Glasses of wine per week     Comment: 2 glasses nightly  . Drug Use: No  . Sexual Activity: Yes    Birth Control/ Protection: Surgical   Other Topics Concern  . Not on file   Social History Narrative  . No narrative on file   Family History  Problem Relation Age of Onset  . Cancer Mother 22    breast  . Thyroid disease Mother   . Cancer Father 41    breast  . Dementia Paternal Grandmother   . Thyroid disease Sister    Patient Active Problem List   Diagnosis Date Noted  . Thyroid nodule 4 mm 02/2013 04/15/2013  . Hypothyroidism 02/25/2013  . Irritability 02/25/2013  . History of endometrial ablation 02/09/2013  . Family  history of breast cancer in mother 02/09/2013  . Family history of breast cancer in female 02/09/2013  . Allergic rhinitis 02/09/2013  . Endometriosis 02/09/2013  . Menorrhagia 03/01/2011   Current Outpatient Prescriptions on File Prior to Visit  Medication Sig Dispense Refill  . ibuprofen (ADVIL,MOTRIN) 200 MG tablet Take 200 mg by mouth every 6 (six) hours as needed. General pain/Cramps      . levothyroxine (SYNTHROID, LEVOTHROID) 75 MCG tablet Take 1 tablet (75 mcg total) by mouth daily.  30 tablet  2  . buPROPion (WELLBUTRIN XL) 150 MG 24 hr tablet Take 1 tablet (150 mg total) by mouth daily.  30 tablet  3  . cetirizine (ZYRTEC) 10 MG tablet Take 10 mg by mouth as needed for allergies.      Marland Kitchen escitalopram (LEXAPRO) 5 MG tablet Take one at hs for one week then 2 tablets at hs  60 tablet  1   No current facility-administered medications on file prior to visit.       Review of Systems See HPI     Objective:   Physical Exam Physical Exam  Nursing  note and vitals reviewed.  Constitutional: She is oriented to person, place, and time. She appears well-developed and well-nourished.  HENT:  Head: Normocephalic and atraumatic.  Cardiovascular: Normal rate and regular rhythm. Exam reveals no gallop and no friction rub.  No murmur heard.  Pulmonary/Chest: Breath sounds normal. She has no wheezes. She has no rales.  Neurological: She is alert and oriented to person, place, and time.  Skin: Skin is warm and dry.  Psychiatric: She has a normal mood and affect. Her behavior is normal.          Assessment & Plan:  New onset hypothyroidism  Will check TSH today  Left thyroid nodule  Will recheck in on e year  Keep CPE appt with me

## 2013-04-16 ENCOUNTER — Encounter: Payer: Self-pay | Admitting: Internal Medicine

## 2013-04-16 LAB — TSH: TSH: 1.061 u[IU]/mL (ref 0.350–4.500)

## 2013-05-11 ENCOUNTER — Encounter: Payer: Self-pay | Admitting: Internal Medicine

## 2013-05-11 ENCOUNTER — Ambulatory Visit (HOSPITAL_BASED_OUTPATIENT_CLINIC_OR_DEPARTMENT_OTHER)
Admission: RE | Admit: 2013-05-11 | Discharge: 2013-05-11 | Disposition: A | Discharge status: Home / Self Care | Payer: BC Managed Care – PPO | Source: Ambulatory Visit | Attending: Internal Medicine | Admitting: Internal Medicine

## 2013-05-11 ENCOUNTER — Ambulatory Visit (INDEPENDENT_AMBULATORY_CARE_PROVIDER_SITE_OTHER): Payer: BC Managed Care – PPO | Admitting: Internal Medicine

## 2013-05-11 VITALS — BP 110/67 | HR 93 | Temp 98.2°F | Resp 18 | Wt 120.0 lb

## 2013-05-11 DIAGNOSIS — J309 Allergic rhinitis, unspecified: Secondary | ICD-10-CM

## 2013-05-11 DIAGNOSIS — Z0000 Encounter for general adult medical examination without abnormal findings: Secondary | ICD-10-CM

## 2013-05-11 DIAGNOSIS — N809 Endometriosis, unspecified: Secondary | ICD-10-CM

## 2013-05-11 DIAGNOSIS — E039 Hypothyroidism, unspecified: Secondary | ICD-10-CM

## 2013-05-11 DIAGNOSIS — Z1151 Encounter for screening for human papillomavirus (HPV): Secondary | ICD-10-CM

## 2013-05-11 DIAGNOSIS — N83209 Unspecified ovarian cyst, unspecified side: Secondary | ICD-10-CM | POA: Insufficient documentation

## 2013-05-11 DIAGNOSIS — Z124 Encounter for screening for malignant neoplasm of cervix: Secondary | ICD-10-CM

## 2013-05-11 DIAGNOSIS — N920 Excessive and frequent menstruation with regular cycle: Secondary | ICD-10-CM | POA: Insufficient documentation

## 2013-05-11 DIAGNOSIS — E041 Nontoxic single thyroid nodule: Secondary | ICD-10-CM

## 2013-05-11 NOTE — Patient Instructions (Signed)
Stop by xray to schedule you ultrasound test  Call me if continued heavy bleeding    See me in 3 months or sooner if more bleeding

## 2013-05-11 NOTE — Progress Notes (Addendum)
Subjective:    Patient ID: Jessica Ball, female    DOB: Jun 29, 1965, 48 y.o.   MRN: [id number]  HPI Ieshia is here for CPE  Overall doing well.  Her menses have returned now that her hypothyroidism is corrected  She has 4 mm nodule found on ultrasound and she is aware we need to repeat this in one year.  She stopped her menses for a few months   She is S/P ablation with her GYN but she states it did not help very much.   No Known Allergies Past Medical History  Diagnosis Date  . Complication of anesthesia   . PONV (postoperative nausea and vomiting)    Past Surgical History  Procedure Laterality Date  . Dilation and curettage of uterus    . Lithotripsy    . Laparoscopic tubal ligation  03/01/2011    Procedure: LAPAROSCOPIC TUBAL LIGATION;  Surgeon: Clarene Duke, MD;  Location: [facility];  Service: Gynecology;  Laterality: Bilateral;  . Urterine ablation     History   Social History  . Marital Status: Married    Spouse Name: N/A    Number of Children: N/A  . Years of Education: N/A   Occupational History  . Not on file.   Social History Main Topics  . Smoking status: Never Smoker   . Smokeless tobacco: Never Used  . Alcohol Use: 1.2 oz/week    2 Glasses of wine per week     Comment: 2 glasses nightly  . Drug Use: No  . Sexual Activity: Yes    Birth Control/ Protection: Surgical   Other Topics Concern  . Not on file   Social History Narrative  . No narrative on file   Family History  Problem Relation Age of Onset  . Cancer Mother 44    breast  . Thyroid disease Mother   . Cancer Father 79    breast  . Dementia Paternal Grandmother   . Thyroid disease Sister    Patient Active Problem List   Diagnosis Date Noted  . Thyroid nodule 4 mm 02/2013 04/15/2013  . Hypothyroidism 02/25/2013  . Irritability 02/25/2013  . History of endometrial ablation 02/09/2013  . Family history of breast cancer in mother 02/09/2013  . Family history of breast cancer in female  02/09/2013  . Allergic rhinitis 02/09/2013  . Endometriosis 02/09/2013  . Menorrhagia 03/01/2011   Current Outpatient Prescriptions on File Prior to Visit  Medication Sig Dispense Refill  . levothyroxine (SYNTHROID, LEVOTHROID) 75 MCG tablet Take 1 tablet (75 mcg total) by mouth daily.  30 tablet  2  . cetirizine (ZYRTEC) 10 MG tablet Take 10 mg by mouth as needed for allergies.      Marland Kitchen ibuprofen (ADVIL,MOTRIN) 200 MG tablet Take 200 mg by mouth every 6 (six) hours as needed. General pain/Cramps       No current facility-administered medications on file prior to visit.       Review of Systems     Objective:   Physical Exam Physical Exam  Vital signs and nursing note reviewed  Constitutional: She is oriented to person, place, and time. She appears well-developed and well-nourished. She is cooperative.  HENT:  Head: Normocephalic and atraumatic.  Right Ear: Tympanic membrane normal.  Left Ear: Tympanic membrane normal.  Nose: Nose normal.  Mouth/Throat: Oropharynx is clear and moist and mucous membranes are normal. No oropharyngeal exudate or posterior oropharyngeal erythema.  Eyes: Conjunctivae and EOM are normal. Pupils are equal, round,  and reactive to light.  Neck: Neck supple. No JVD present. Carotid bruit is not present. No mass and no thyromegaly present.  Cardiovascular: Regular rhythm, normal heart sounds, intact distal pulses and normal pulses.  Exam reveals no gallop and no friction rub.   No murmur heard. Pulses:      Dorsalis pedis pulses are 2+ on the right side, and 2+ on the left side.  Pulmonary/Chest: Breath sounds normal. She has no wheezes. She has no rhonchi. She has no rales. Right breast exhibits no mass, no nipple discharge and no skin change. Left breast exhibits no mass, no nipple discharge and no skin change.  Abdominal: Soft. Bowel sounds are normal. She exhibits no distension and no mass. There is no hepatosplenomegaly. There is no tenderness. There is  no CVA tenderness.  Genitourinary: Rectum normal, vagina normal and uterus normal. Rectal exam shows no mass. Guaiac negative stool. No labial fusion. There is no lesion on the right labia. There is no lesion on the left labia. Cervix exhibits no motion tenderness. Right adnexum displays no mass, no tenderness and no fullness. Left adnexum displays no mass, no tenderness and no fullness. No erythema around the vagina.  Musculoskeletal:       No active synovitis to any joint.    Lymphadenopathy:       Right cervical: No superficial cervical adenopathy present.      Left cervical: No superficial cervical adenopathy present.       Right axillary: No pectoral and no lateral adenopathy present.       Left axillary: No pectoral and no lateral adenopathy present.      Right: No inguinal adenopathy present.       Left: No inguinal adenopathy present.  Neurological: She is alert and oriented to person, place, and time. She has normal strength and normal reflexes. No cranial nerve deficit or sensory deficit. She displays a negative Romberg sign. Coordination and gait normal.  Skin: Skin is warm and dry. No abrasion, no bruising, no ecchymosis and no rash noted. No cyanosis. Nails show no clubbing.  Psychiatric: She has a normal mood and affect. Her speech is normal and behavior is normal.          Assessment & Plan:   Health Maintenance  Tdap today  See scanned sheet.  [employer] of new smoking CT guidelines.  She only smoked short time in college   DUB  Will get pelvic/TVUS  Thyroid now corrected.  If continues will need to see Dr. Waylan Rocher  Hypothyroidism  Now corrected  Thyroid nodule  Will need repeat in 2016  4 mm nodule  Endometriosis  FH breast Ca  Relative all BRCA neg  See me in 3 months or sooner if heavy bleeding  Addendum 03/31/2014   Pt was notified by Margaretha Sheffield my CMA that she needed a repeat thyroid ultrasound as recommended by the interpreting radiologist.  She has declined  testing.  Will send certified letter and copy of prior ultrasound report     Assessment & Plan:

## 2013-05-13 ENCOUNTER — Encounter: Payer: Self-pay | Admitting: Internal Medicine

## 2013-05-14 ENCOUNTER — Telehealth: Payer: Self-pay | Admitting: Internal Medicine

## 2013-05-14 NOTE — Telephone Encounter (Signed)
Spoke with pt .   And informed of pelvic U/S results.  I strongly advised with her continued spotting and findings on ultrasound indluding possible hydrosalpinx on left ovary that she see her [facility] Dr. Zane HeraldMeiseinger.   Pt prefers to make her own appt.  She has very litlle vaginal spotting now.  Advise if bleeding becomes heavy [facility]   Pt voiced understanding.  Will fax U/s report and labs to Dr. Magda Paganinimeisenger's office

## 2013-05-27 ENCOUNTER — Ambulatory Visit: Payer: BC Managed Care – PPO | Admitting: Internal Medicine

## 2013-06-01 ENCOUNTER — Other Ambulatory Visit: Payer: Self-pay | Admitting: Internal Medicine

## 2013-06-02 NOTE — Telephone Encounter (Signed)
Refill request

## 2013-06-14 ENCOUNTER — Encounter: Payer: Self-pay | Admitting: Internal Medicine

## 2013-08-11 ENCOUNTER — Ambulatory Visit: Payer: BC Managed Care – PPO | Admitting: Internal Medicine

## 2013-11-09 ENCOUNTER — Encounter: Payer: Self-pay | Admitting: Internal Medicine

## 2014-03-31 ENCOUNTER — Encounter: Payer: Self-pay | Admitting: Internal Medicine

## 2014-03-31 ENCOUNTER — Telehealth: Payer: Self-pay | Admitting: Internal Medicine

## 2014-03-31 DIAGNOSIS — E041 Nontoxic single thyroid nodule: Secondary | ICD-10-CM

## 2014-03-31 NOTE — Telephone Encounter (Signed)
I spoke with [name] and she has declined to have the thyroid U/S repeated. She does not feel that it is needed

## 2014-03-31 NOTE — Telephone Encounter (Signed)
Jessica Ball  Call pt and advise her that she is due for the one year follow up of her thyroid ultrasound.  [facility]  Route back with date this will be done

## 2015-02-04 ENCOUNTER — Other Ambulatory Visit: Payer: Self-pay

## 2015-02-04 DIAGNOSIS — Z1231 Encounter for screening mammogram for malignant neoplasm of breast: Secondary | ICD-10-CM

## 2015-02-23 ENCOUNTER — Ambulatory Visit
Admission: RE | Admit: 2015-02-23 | Discharge: 2015-02-23 | Disposition: A | Discharge status: Home / Self Care | Payer: BLUE CROSS/BLUE SHIELD | Source: Ambulatory Visit

## 2015-02-23 DIAGNOSIS — Z1231 Encounter for screening mammogram for malignant neoplasm of breast: Secondary | ICD-10-CM

## 2016-03-13 IMAGING — US US PELVIS COMPLETE
1 series · 13 of 25 positions shown · non-contrast
Comparison: None

CLINICAL DATA: Menorrhagia.

EXAM:
TRANSABDOMINAL AND TRANSVAGINAL ULTRASOUND OF PELVIS
TECHNIQUE: Both transabdominal and transvaginal ultrasound examinations of the
pelvis were performed. Transabdominal technique was performed for
global imaging of the pelvis including uterus, ovaries, adnexal
regions, and pelvic cul-de-sac. It was necessary to proceed with
endovaginal exam following the transabdominal exam to visualize the
endometrium and left ovary.

[Series 1: us pelvis complete · 0.30mm/px · 13 of 59 slices shown]
[im 1/59]
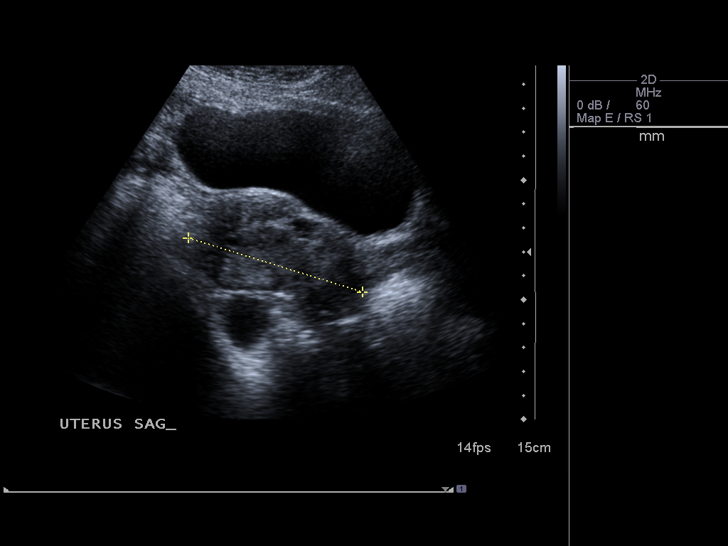
[im 5/59]
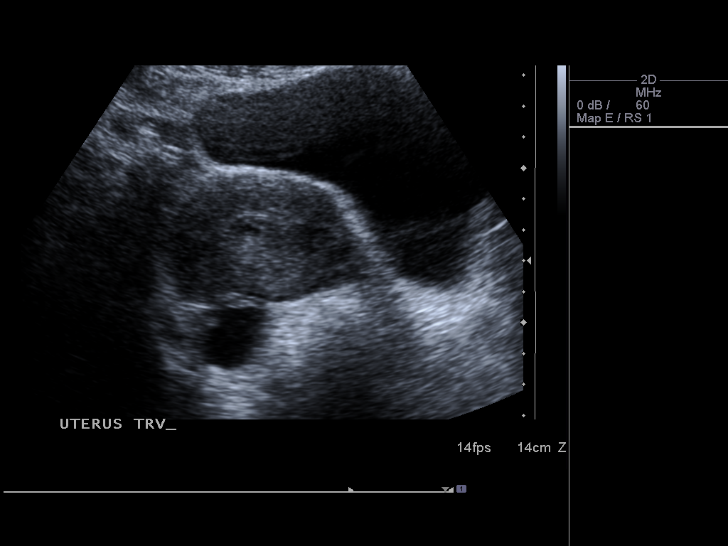
[im 10/59]
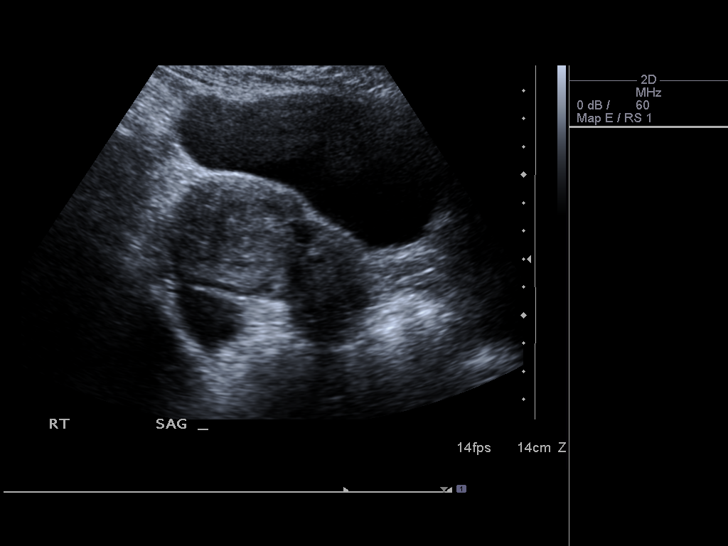
[im 15/59]
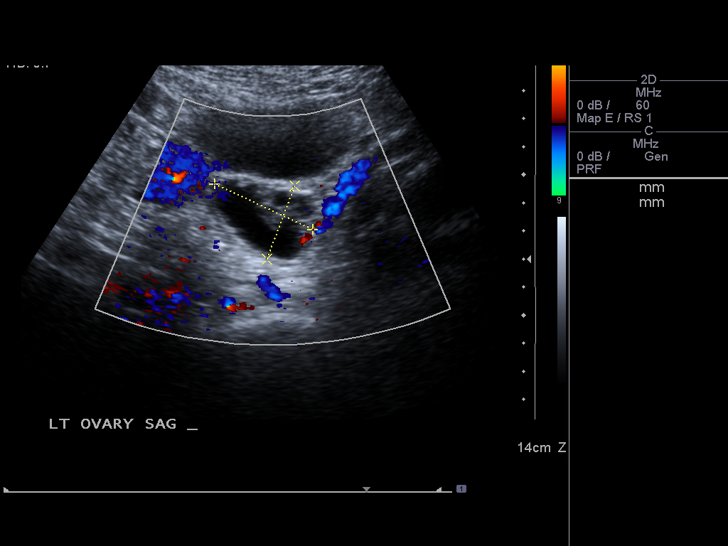
[im 20/59]
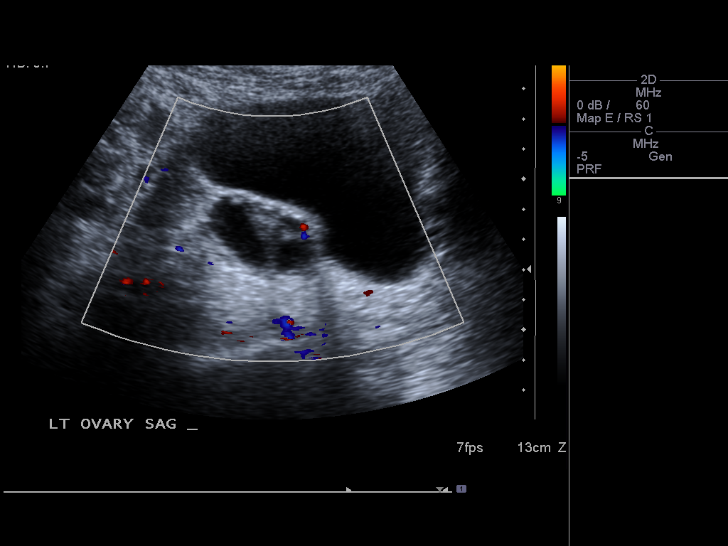
[im 25/59]
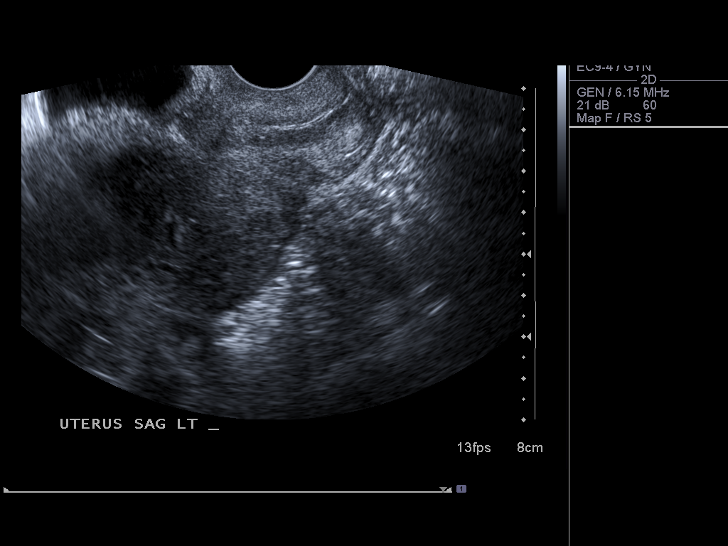
[im 30/59]
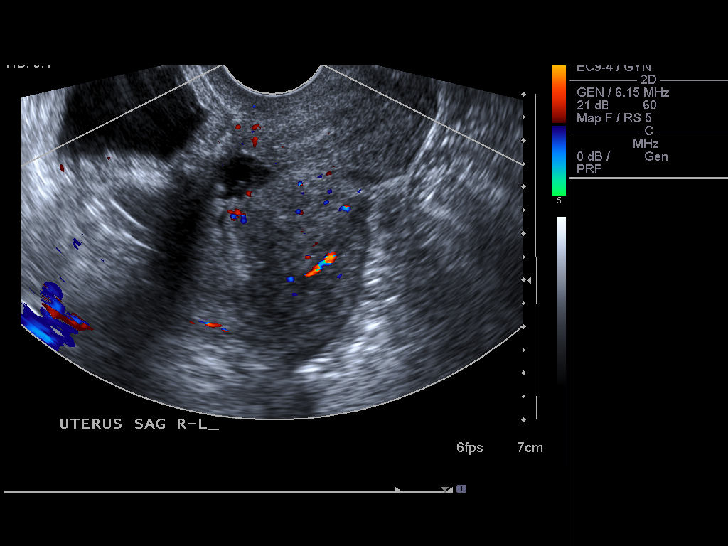
[im 34/59]
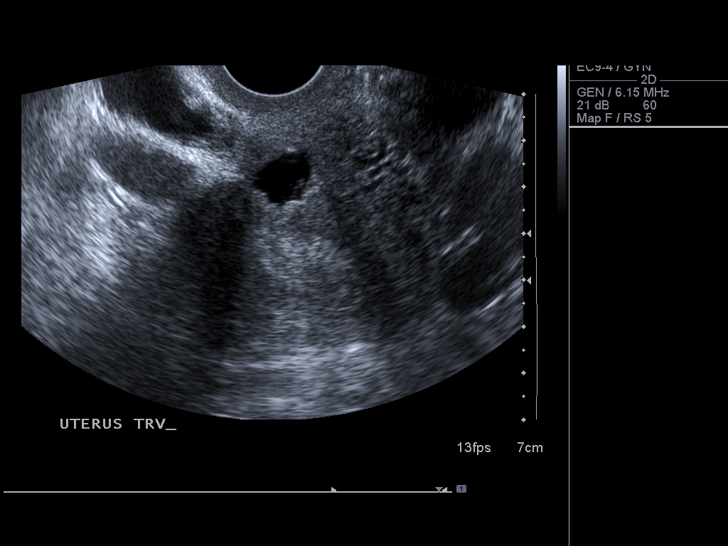
[im 39/59]
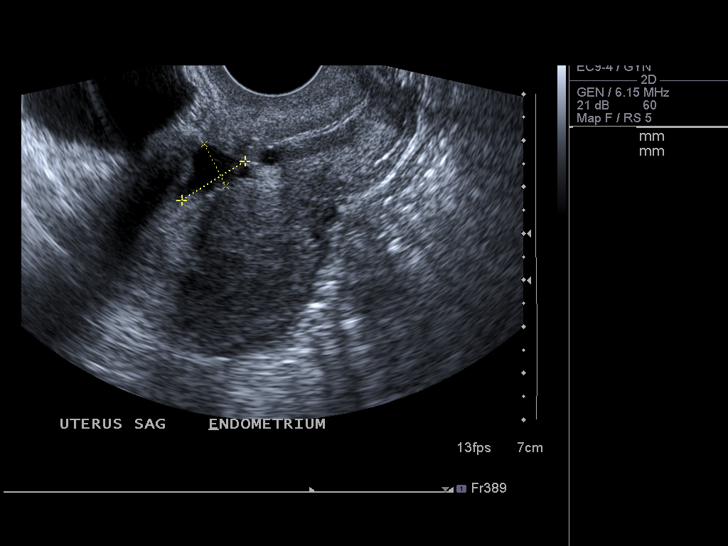
[im 44/59]
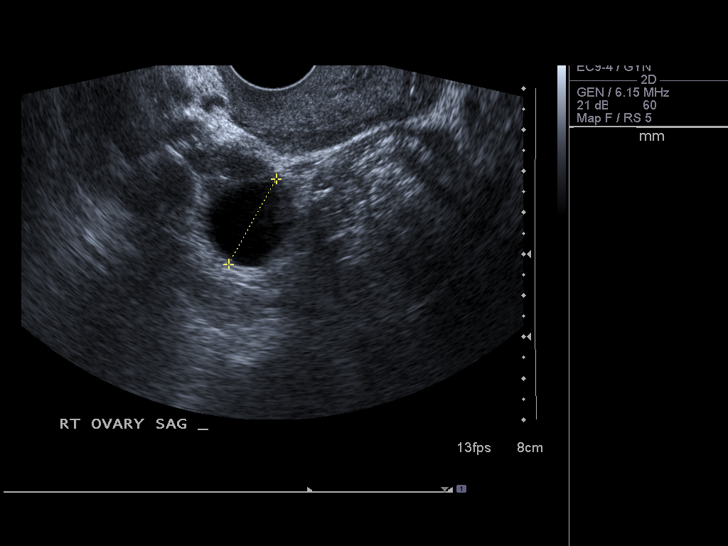
[im 49/59]
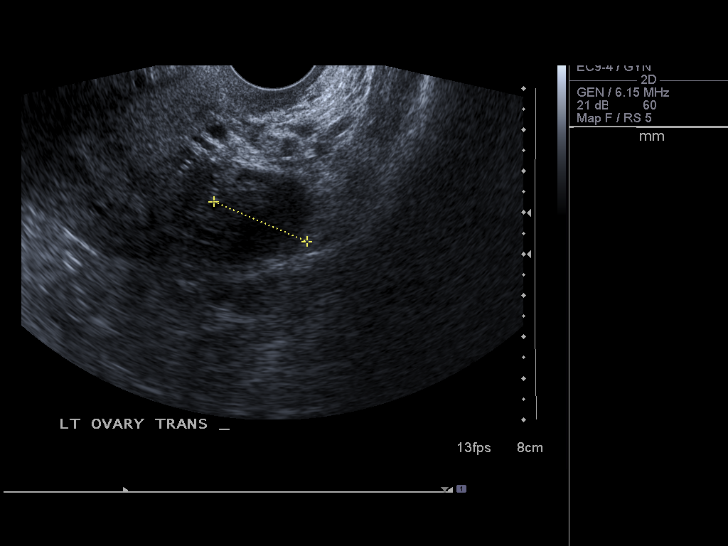
[im 54/59]
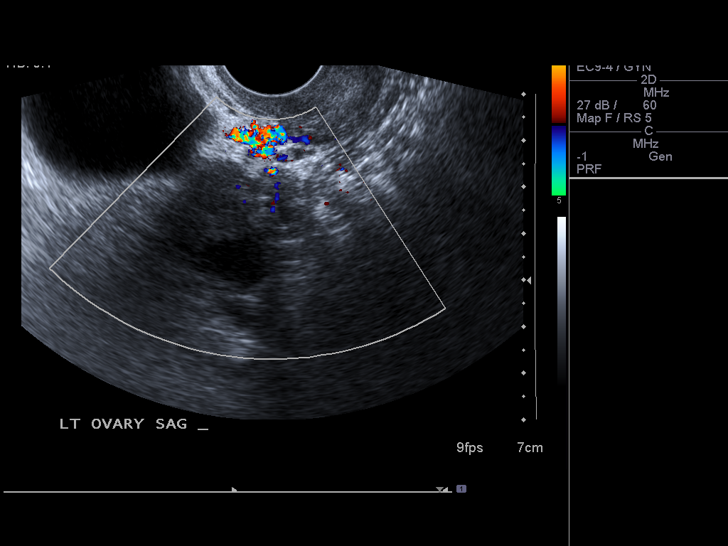
[im 59/59]
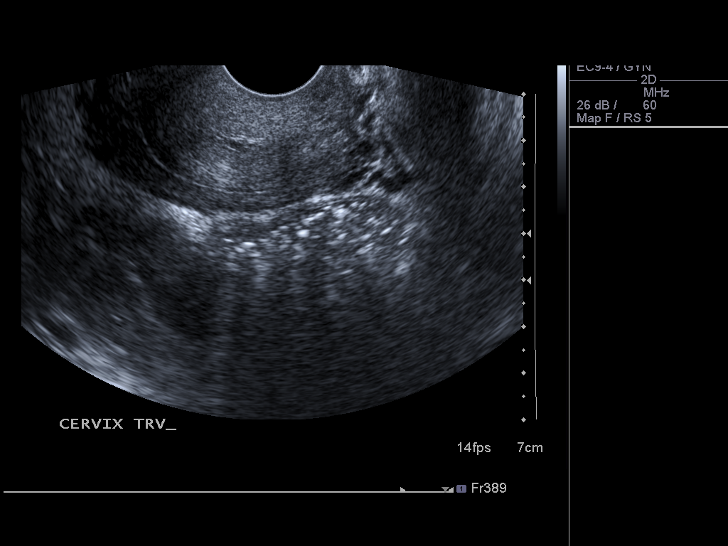

[13 of 25 positions shown; findings below may reference images not displayed]

FINDINGS: Uterus

Measurements: 8.8 x 4.3 x 6.1 cm. The uterus was retroflexed and
appeared to contain 2 separate endometrial canals suggestive of
either septate or subseptate morphology.

Endometrium

Thickness: 7 mm on the right and 4 mm on the left. Fluid collection
in the lower uterine segment measured 1.6 cm.

Right ovary

Measurements: 3.6 x 2.4 x 3.1 cm. 2.4 x 2.0 x 2.7 cm simple cyst.

Left ovary

Measurements: 3.9 x 2.8 x 2.7 cm. An elongated, possibly tubular
hypoechoic region within or immediately adjacent to the left ovary
measures 1.2 cm in diameter.

Other findings

No free fluid.
IMPRESSION: 1. Septate or subseptate uterus.
2. Endometrium measures up to 7 mm on the right. If the patient is
premenopausal, this is within normal limits. If the patient is
postmenopausal with abnormal uterine bleeding, this is abnormally
thickened and endometrial biopsy should be considered.
3. 2.7 cm simple right ovarian cyst.
4. Left ovarian cyst or possibly hydrosalpinx. If clinically
indicated, this finding as well as the uterine morphology could be
further evaluated with pelvic MRI. Otherwise, consider follow-up
pelvic ultrasound in 6-12 weeks.

## 2016-05-16 ENCOUNTER — Other Ambulatory Visit: Payer: Self-pay | Admitting: Family Medicine

## 2016-05-16 DIAGNOSIS — R921 Mammographic calcification found on diagnostic imaging of breast: Secondary | ICD-10-CM

## 2016-05-23 ENCOUNTER — Ambulatory Visit
Admission: RE | Admit: 2016-05-23 | Discharge: 2016-05-23 | Disposition: A | Discharge status: Home / Self Care | Payer: BLUE CROSS/BLUE SHIELD | Source: Ambulatory Visit | Attending: Family Medicine | Admitting: Family Medicine

## 2016-05-23 ENCOUNTER — Other Ambulatory Visit: Payer: Self-pay | Admitting: Family Medicine

## 2016-05-23 DIAGNOSIS — R921 Mammographic calcification found on diagnostic imaging of breast: Secondary | ICD-10-CM

## 2017-08-15 ENCOUNTER — Other Ambulatory Visit: Payer: Self-pay | Admitting: Family Medicine

## 2017-08-15 DIAGNOSIS — Z1231 Encounter for screening mammogram for malignant neoplasm of breast: Secondary | ICD-10-CM

## 2017-08-15 DIAGNOSIS — N6489 Other specified disorders of breast: Secondary | ICD-10-CM

## 2017-08-23 ENCOUNTER — Ambulatory Visit
Admission: RE | Admit: 2017-08-23 | Discharge: 2017-08-23 | Disposition: A | Discharge status: Home / Self Care | Payer: BLUE CROSS/BLUE SHIELD | Source: Ambulatory Visit | Attending: Family Medicine | Admitting: Family Medicine

## 2017-08-23 DIAGNOSIS — N6489 Other specified disorders of breast: Secondary | ICD-10-CM

## 2018-08-25 ENCOUNTER — Other Ambulatory Visit: Payer: Self-pay | Admitting: Family Medicine

## 2018-08-25 DIAGNOSIS — Z1231 Encounter for screening mammogram for malignant neoplasm of breast: Secondary | ICD-10-CM

## 2018-10-10 ENCOUNTER — Other Ambulatory Visit: Payer: Self-pay

## 2018-10-10 ENCOUNTER — Ambulatory Visit
Admission: RE | Admit: 2018-10-10 | Discharge: 2018-10-10 | Disposition: A | Discharge status: Home / Self Care | Payer: BLUE CROSS/BLUE SHIELD | Source: Ambulatory Visit | Attending: Family Medicine | Admitting: Family Medicine

## 2018-10-10 DIAGNOSIS — Z1231 Encounter for screening mammogram for malignant neoplasm of breast: Secondary | ICD-10-CM

## 2018-11-11 ENCOUNTER — Other Ambulatory Visit: Payer: Self-pay

## 2018-11-11 DIAGNOSIS — Z20822 Contact with and (suspected) exposure to covid-19: Secondary | ICD-10-CM

## 2018-11-12 LAB — NOVEL CORONAVIRUS, NAA: SARS-CoV-2, NAA: NOT DETECTED

## 2019-10-06 ENCOUNTER — Other Ambulatory Visit: Payer: Self-pay | Admitting: Family Medicine

## 2019-10-06 DIAGNOSIS — Z1231 Encounter for screening mammogram for malignant neoplasm of breast: Secondary | ICD-10-CM

## 2019-11-13 ENCOUNTER — Other Ambulatory Visit: Payer: Self-pay

## 2019-11-13 ENCOUNTER — Ambulatory Visit
Admission: RE | Admit: 2019-11-13 | Discharge: 2019-11-13 | Disposition: A | Discharge status: Home / Self Care | Payer: BC Managed Care – PPO | Source: Ambulatory Visit | Attending: Family Medicine | Admitting: Family Medicine

## 2019-11-13 DIAGNOSIS — Z1231 Encounter for screening mammogram for malignant neoplasm of breast: Secondary | ICD-10-CM

## 2019-11-17 ENCOUNTER — Other Ambulatory Visit: Payer: Self-pay | Admitting: Family Medicine

## 2019-11-17 DIAGNOSIS — N63 Unspecified lump in unspecified breast: Secondary | ICD-10-CM

## 2019-11-30 ENCOUNTER — Ambulatory Visit
Admission: RE | Admit: 2019-11-30 | Discharge: 2019-11-30 | Disposition: A | Discharge status: Home / Self Care | Payer: BLUE CROSS/BLUE SHIELD | Source: Ambulatory Visit | Attending: Family Medicine | Admitting: Family Medicine

## 2019-11-30 ENCOUNTER — Other Ambulatory Visit: Payer: Self-pay

## 2019-11-30 DIAGNOSIS — N63 Unspecified lump in unspecified breast: Secondary | ICD-10-CM

## 2020-11-15 ENCOUNTER — Other Ambulatory Visit: Payer: Self-pay | Admitting: Family Medicine

## 2020-11-15 DIAGNOSIS — Z1231 Encounter for screening mammogram for malignant neoplasm of breast: Secondary | ICD-10-CM

## 2020-11-17 ENCOUNTER — Ambulatory Visit
Admission: RE | Admit: 2020-11-17 | Discharge: 2020-11-17 | Disposition: A | Discharge status: Home / Self Care | Payer: BLUE CROSS/BLUE SHIELD | Source: Ambulatory Visit | Attending: Family Medicine | Admitting: Family Medicine

## 2020-11-17 ENCOUNTER — Other Ambulatory Visit: Payer: Self-pay

## 2020-11-17 DIAGNOSIS — Z1231 Encounter for screening mammogram for malignant neoplasm of breast: Secondary | ICD-10-CM

## 2022-03-20 ENCOUNTER — Other Ambulatory Visit: Payer: Self-pay | Admitting: Family Medicine

## 2022-03-20 ENCOUNTER — Other Ambulatory Visit: Payer: Self-pay

## 2022-03-20 DIAGNOSIS — Z1231 Encounter for screening mammogram for malignant neoplasm of breast: Secondary | ICD-10-CM

## 2022-05-08 ENCOUNTER — Ambulatory Visit
Admission: RE | Admit: 2022-05-08 | Discharge: 2022-05-08 | Disposition: A | Discharge status: Home / Self Care | Payer: BC Managed Care – PPO | Source: Ambulatory Visit | Attending: Family Medicine | Admitting: Family Medicine

## 2022-05-08 DIAGNOSIS — Z1231 Encounter for screening mammogram for malignant neoplasm of breast: Secondary | ICD-10-CM

## 2023-07-24 ENCOUNTER — Other Ambulatory Visit: Payer: Self-pay | Admitting: Family Medicine

## 2023-07-24 DIAGNOSIS — Z1231 Encounter for screening mammogram for malignant neoplasm of breast: Secondary | ICD-10-CM

## 2023-08-06 ENCOUNTER — Ambulatory Visit
Admission: RE | Admit: 2023-08-06 | Discharge: 2023-08-06 | Disposition: A | Discharge status: Home / Self Care | Source: Ambulatory Visit | Attending: Family Medicine

## 2023-08-06 ENCOUNTER — Encounter: Payer: Self-pay | Admitting: Radiology

## 2023-08-06 DIAGNOSIS — Z1231 Encounter for screening mammogram for malignant neoplasm of breast: Secondary | ICD-10-CM

## 2023-09-20 IMAGING — MG MM DIGITAL SCREENING BILAT W/ TOMO AND CAD
6 of 10 series · 6 of 30 positions shown · non-contrast
Comparison: Previous exam(s).

CLINICAL DATA: Screening.

EXAM:
DIGITAL SCREENING BILATERAL MAMMOGRAM WITH TOMOSYNTHESIS AND CAD
TECHNIQUE: Bilateral screening digital craniocaudal and mediolateral oblique
mammograms were obtained. Bilateral screening digital breast
tomosynthesis was performed. The images were evaluated with
computer-aided detection.

[L CC synth-2D (1 of 2)]
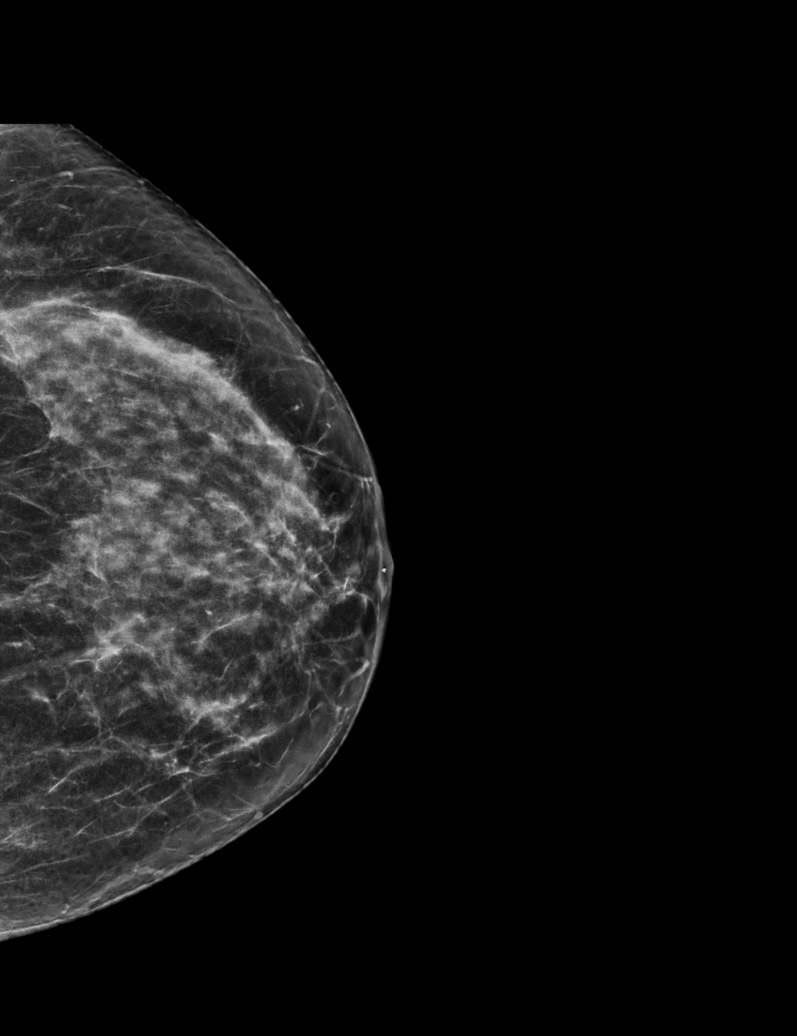

[R MLO synth-2D]
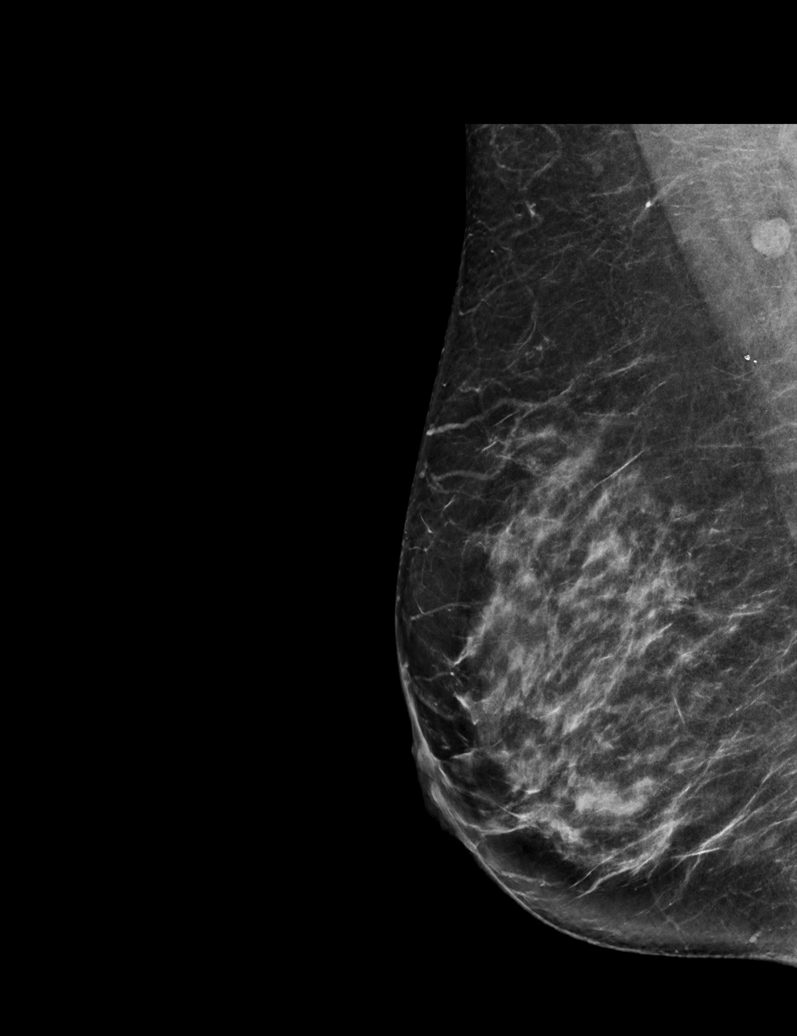

[L CC synth-2D (2 of 2)]
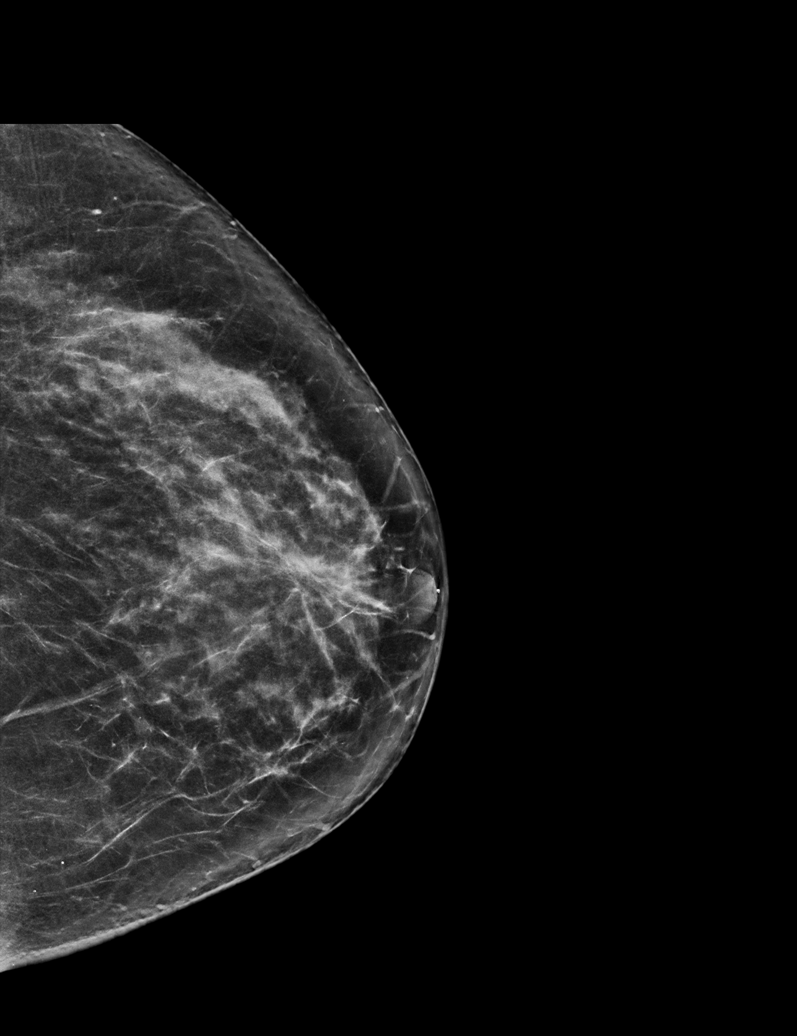

[L MLO synth-2D]
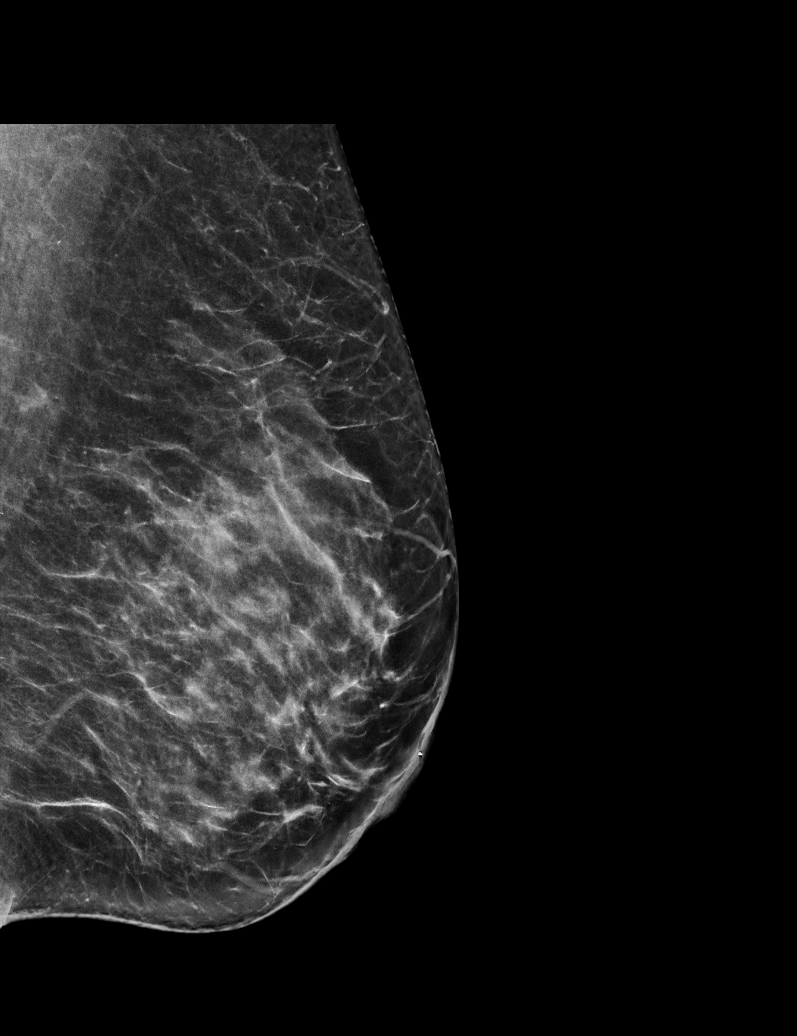

[R CC synth-2D]
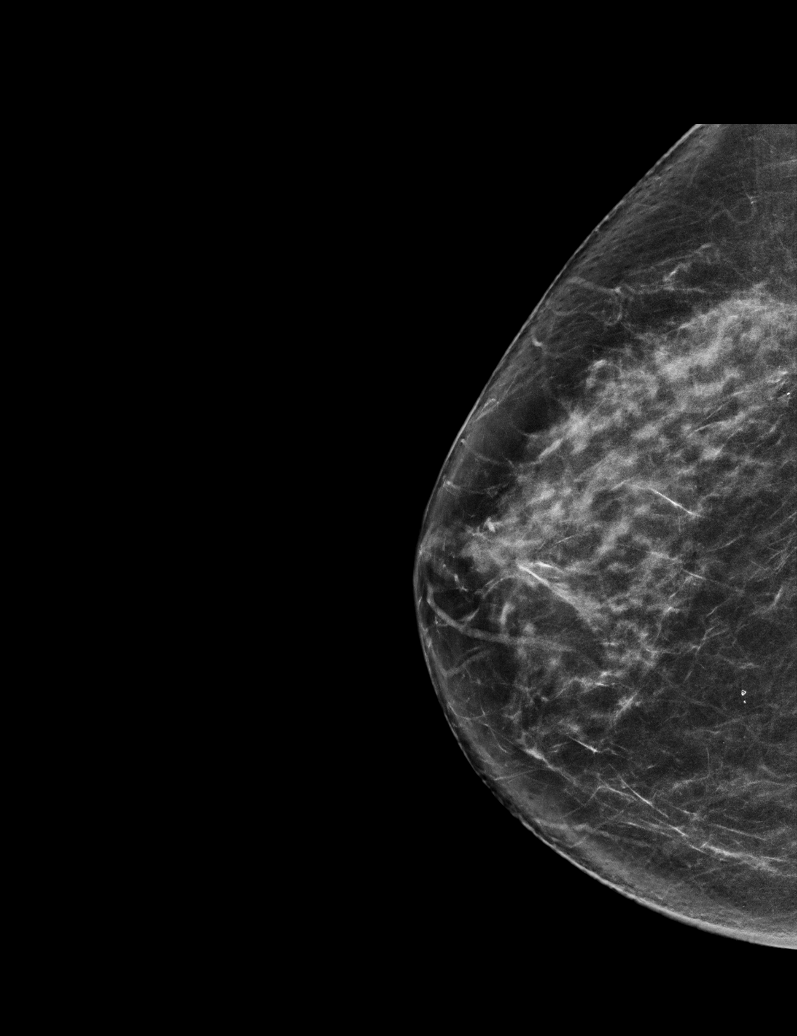

[L CC tomo · tomo slice 34/67.0]
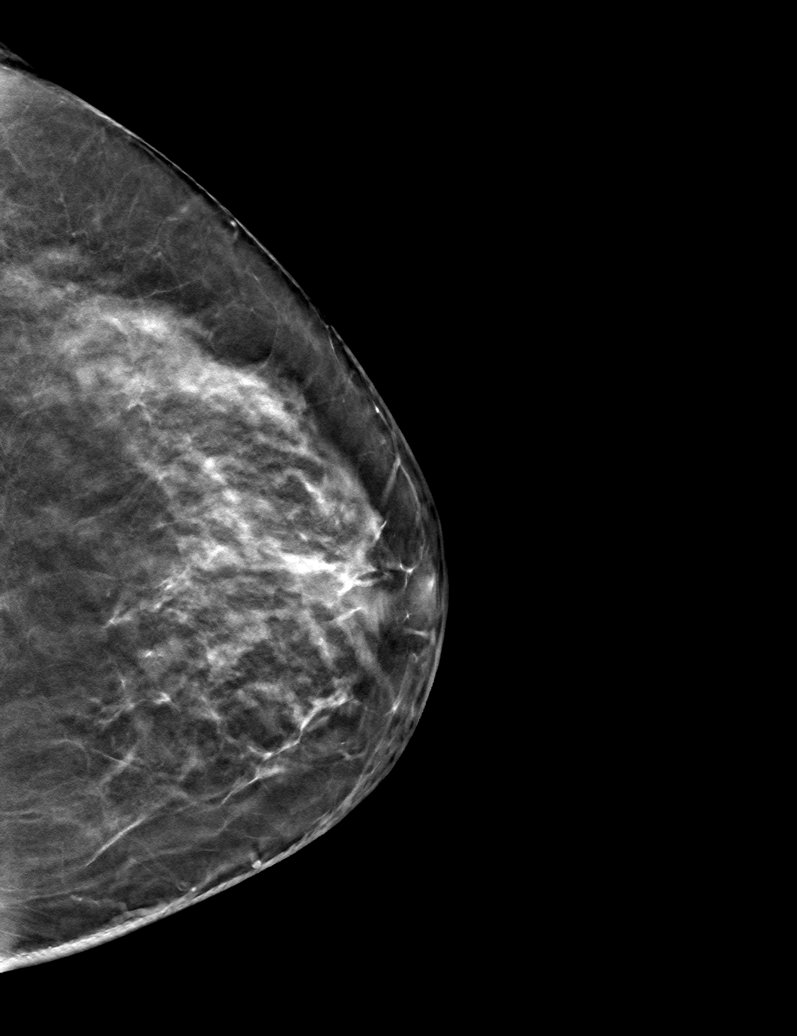

[6 of 30 positions shown; findings below may reference images not displayed]

ACR Breast Density Category c: The breast tissue is heterogeneously
dense, which may obscure small masses.
FINDINGS: There are no findings suspicious for malignancy.
IMPRESSION: No mammographic evidence of malignancy. A result letter of this
screening mammogram will be mailed directly to the patient.

RECOMMENDATION:
Screening mammogram in one year. (Code:Q3-W-BC3)

BI-RADS CATEGORY  1: Negative.
